# Patient Record
Sex: Female | Born: 1975 | Race: Black or African American | Hispanic: No | Marital: Single | Smoking: Never smoker
Health system: Southern US, Community
[De-identification: ages and names within clinical notes are randomized; demographics above are authoritative.]

## PROBLEM LIST (undated history)

## (undated) DIAGNOSIS — E7800 Pure hypercholesterolemia, unspecified: Secondary | ICD-10-CM

## (undated) DIAGNOSIS — I10 Essential (primary) hypertension: Secondary | ICD-10-CM

## (undated) DIAGNOSIS — E119 Type 2 diabetes mellitus without complications: Secondary | ICD-10-CM

## (undated) HISTORY — PX: CHOLECYSTECTOMY: SHX55

---

## 2018-04-28 ENCOUNTER — Emergency Department (HOSPITAL_BASED_OUTPATIENT_CLINIC_OR_DEPARTMENT_OTHER): Payer: BLUE CROSS/BLUE SHIELD

## 2018-04-28 ENCOUNTER — Other Ambulatory Visit: Payer: Self-pay

## 2018-04-28 ENCOUNTER — Inpatient Hospital Stay (HOSPITAL_BASED_OUTPATIENT_CLINIC_OR_DEPARTMENT_OTHER)
Admission: EM | Admit: 2018-04-28 | Discharge: 2018-04-30 | DRG: 872 | Disposition: A | Discharge status: Home / Self Care | Payer: BLUE CROSS/BLUE SHIELD | Attending: Family Medicine | Admitting: Family Medicine

## 2018-04-28 ENCOUNTER — Encounter (HOSPITAL_BASED_OUTPATIENT_CLINIC_OR_DEPARTMENT_OTHER): Payer: Self-pay

## 2018-04-28 DIAGNOSIS — N179 Acute kidney failure, unspecified: Secondary | ICD-10-CM | POA: Diagnosis present

## 2018-04-28 DIAGNOSIS — Z79899 Other long term (current) drug therapy: Secondary | ICD-10-CM

## 2018-04-28 DIAGNOSIS — IMO0002 Reserved for concepts with insufficient information to code with codable children: Secondary | ICD-10-CM | POA: Diagnosis present

## 2018-04-28 DIAGNOSIS — E872 Acidosis, unspecified: Secondary | ICD-10-CM

## 2018-04-28 DIAGNOSIS — Z888 Allergy status to other drugs, medicaments and biological substances status: Secondary | ICD-10-CM

## 2018-04-28 DIAGNOSIS — L309 Dermatitis, unspecified: Secondary | ICD-10-CM | POA: Diagnosis present

## 2018-04-28 DIAGNOSIS — R739 Hyperglycemia, unspecified: Secondary | ICD-10-CM | POA: Diagnosis not present

## 2018-04-28 DIAGNOSIS — D849 Immunodeficiency, unspecified: Secondary | ICD-10-CM

## 2018-04-28 DIAGNOSIS — A4151 Sepsis due to Escherichia coli [E. coli]: Principal | ICD-10-CM | POA: Diagnosis present

## 2018-04-28 DIAGNOSIS — N3000 Acute cystitis without hematuria: Secondary | ICD-10-CM | POA: Diagnosis present

## 2018-04-28 DIAGNOSIS — E1165 Type 2 diabetes mellitus with hyperglycemia: Secondary | ICD-10-CM | POA: Diagnosis present

## 2018-04-28 DIAGNOSIS — E871 Hypo-osmolality and hyponatremia: Secondary | ICD-10-CM | POA: Diagnosis present

## 2018-04-28 DIAGNOSIS — E860 Dehydration: Secondary | ICD-10-CM | POA: Diagnosis present

## 2018-04-28 DIAGNOSIS — B370 Candidal stomatitis: Secondary | ICD-10-CM | POA: Diagnosis not present

## 2018-04-28 DIAGNOSIS — A419 Sepsis, unspecified organism: Secondary | ICD-10-CM | POA: Diagnosis present

## 2018-04-28 DIAGNOSIS — E876 Hypokalemia: Secondary | ICD-10-CM | POA: Diagnosis present

## 2018-04-28 DIAGNOSIS — N390 Urinary tract infection, site not specified: Secondary | ICD-10-CM | POA: Diagnosis not present

## 2018-04-28 DIAGNOSIS — I10 Essential (primary) hypertension: Secondary | ICD-10-CM | POA: Diagnosis present

## 2018-04-28 DIAGNOSIS — R6510 Systemic inflammatory response syndrome (SIRS) of non-infectious origin without acute organ dysfunction: Secondary | ICD-10-CM | POA: Diagnosis not present

## 2018-04-28 DIAGNOSIS — E7800 Pure hypercholesterolemia, unspecified: Secondary | ICD-10-CM | POA: Diagnosis present

## 2018-04-28 DIAGNOSIS — E0865 Diabetes mellitus due to underlying condition with hyperglycemia: Secondary | ICD-10-CM | POA: Diagnosis not present

## 2018-04-28 DIAGNOSIS — Z9641 Presence of insulin pump (external) (internal): Secondary | ICD-10-CM | POA: Diagnosis present

## 2018-04-28 DIAGNOSIS — Z7982 Long term (current) use of aspirin: Secondary | ICD-10-CM

## 2018-04-28 DIAGNOSIS — D899 Disorder involving the immune mechanism, unspecified: Secondary | ICD-10-CM

## 2018-04-28 HISTORY — DX: Type 2 diabetes mellitus without complications: E11.9

## 2018-04-28 HISTORY — DX: Essential (primary) hypertension: I10

## 2018-04-28 HISTORY — DX: Pure hypercholesterolemia, unspecified: E78.00

## 2018-04-28 LAB — CBC WITH DIFFERENTIAL/PLATELET
BASOS ABS: 0 10*3/uL (ref 0.0–0.1)
BASOS PCT: 0 %
EOS PCT: 1 %
Eosinophils Absolute: 0.1 10*3/uL (ref 0.0–0.7)
HCT: 42.8 % (ref 36.0–46.0)
Hemoglobin: 14.8 g/dL (ref 12.0–15.0)
LYMPHS PCT: 19 %
Lymphs Abs: 1.8 10*3/uL (ref 0.7–4.0)
MCH: 28.6 pg (ref 26.0–34.0)
MCHC: 34.6 g/dL (ref 30.0–36.0)
MCV: 82.8 fL (ref 78.0–100.0)
Monocytes Absolute: 0.9 10*3/uL (ref 0.1–1.0)
Monocytes Relative: 10 %
Neutro Abs: 6.7 10*3/uL (ref 1.7–7.7)
Neutrophils Relative %: 70 %
PLATELETS: 654 10*3/uL — AB (ref 150–400)
RBC: 5.17 MIL/uL — AB (ref 3.87–5.11)
RDW: 14.6 % (ref 11.5–15.5)
WBC: 9.5 10*3/uL (ref 4.0–10.5)

## 2018-04-28 LAB — URINALYSIS, ROUTINE W REFLEX MICROSCOPIC
KETONES UR: 15 mg/dL — AB
Nitrite: POSITIVE — AB
PH: 5.5 (ref 5.0–8.0)
Protein, ur: >300 mg/dL — AB
Specific Gravity, Urine: 1.02 (ref 1.005–1.030)

## 2018-04-28 LAB — I-STAT VENOUS BLOOD GAS, ED
Acid-Base Excess: 10 mmol/L — ABNORMAL HIGH (ref 0.0–2.0)
BICARBONATE: 34.5 mmol/L — AB (ref 20.0–28.0)
O2 Saturation: 35 %
PCO2 VEN: 44.8 mmHg (ref 44.0–60.0)
PO2 VEN: 20 mmHg — AB (ref 32.0–45.0)
Patient temperature: 100
TCO2: 36 mmol/L — ABNORMAL HIGH (ref 22–32)
pH, Ven: 7.497 — ABNORMAL HIGH (ref 7.250–7.430)

## 2018-04-28 LAB — COMPREHENSIVE METABOLIC PANEL
ALT: 11 U/L (ref 0–44)
AST: 18 U/L (ref 15–41)
Albumin: 4.4 g/dL (ref 3.5–5.0)
Alkaline Phosphatase: 142 U/L — ABNORMAL HIGH (ref 38–126)
Anion gap: 15 (ref 5–15)
BILIRUBIN TOTAL: 0.5 mg/dL (ref 0.3–1.2)
BUN: 16 mg/dL (ref 6–20)
CO2: 36 mmol/L — ABNORMAL HIGH (ref 22–32)
CREATININE: 1.37 mg/dL — AB (ref 0.44–1.00)
Calcium: 9.9 mg/dL (ref 8.9–10.3)
Chloride: 77 mmol/L — ABNORMAL LOW (ref 98–111)
GFR calc Af Amer: 54 mL/min — ABNORMAL LOW (ref >60)
GFR, EST NON AFRICAN AMERICAN: 47 mL/min — AB (ref >60)
Glucose, Bld: 452 mg/dL — ABNORMAL HIGH (ref 70–99)
POTASSIUM: 3.3 mmol/L — AB (ref 3.5–5.1)
Sodium: 128 mmol/L — ABNORMAL LOW (ref 135–145)
TOTAL PROTEIN: 9.8 g/dL — AB (ref 6.5–8.1)

## 2018-04-28 LAB — I-STAT CG4 LACTIC ACID, ED
LACTIC ACID, VENOUS: 3.13 mmol/L — AB (ref 0.5–1.9)
Lactic Acid, Venous: 2.31 mmol/L (ref 0.5–1.9)

## 2018-04-28 LAB — GLUCOSE, CAPILLARY
Glucose-Capillary: 244 mg/dL — ABNORMAL HIGH (ref 70–99)
Glucose-Capillary: 131 mg/dL — ABNORMAL HIGH (ref 70–99)

## 2018-04-28 LAB — CBG MONITORING, ED
Glucose-Capillary: 466 mg/dL — ABNORMAL HIGH (ref 70–99)
Glucose-Capillary: 350 mg/dL — ABNORMAL HIGH (ref 70–99)

## 2018-04-28 LAB — RAPID URINE DRUG SCREEN, HOSP PERFORMED
Amphetamines: NOT DETECTED
Benzodiazepines: POSITIVE — AB
Cocaine: NOT DETECTED
OPIATES: NOT DETECTED
Tetrahydrocannabinol: NOT DETECTED

## 2018-04-28 LAB — URINALYSIS, MICROSCOPIC (REFLEX)

## 2018-04-28 LAB — PREGNANCY, URINE: PREG TEST UR: NEGATIVE

## 2018-04-28 MED ORDER — ROSUVASTATIN CALCIUM 10 MG PO TABS
20.0000 mg | ORAL_TABLET | Freq: Every day | ORAL | Status: DC
  Administered 2018-04-28 – 2018-04-29 (×2): 20 mg via ORAL
  Filled 2018-04-28 (×2): qty 2

## 2018-04-28 MED ORDER — ONDANSETRON HCL 4 MG/2ML IJ SOLN: 4.0000 mg | Freq: Four times a day (QID) | INTRAMUSCULAR | Status: DC | PRN

## 2018-04-28 MED ORDER — ACETAMINOPHEN 325 MG PO TABS
650.0000 mg | ORAL_TABLET | Freq: Four times a day (QID) | ORAL | Status: DC | PRN
  Administered 2018-04-28 – 2018-04-29 (×2): 650 mg via ORAL
  Filled 2018-04-28 (×2): qty 2

## 2018-04-28 MED ORDER — ONDANSETRON HCL 4 MG PO TABS: 4.0000 mg | ORAL_TABLET | Freq: Four times a day (QID) | ORAL | Status: DC | PRN

## 2018-04-28 MED ORDER — IPRATROPIUM-ALBUTEROL 20-100 MCG/ACT IN AERS: 1.0000 | INHALATION_SPRAY | Freq: Four times a day (QID) | RESPIRATORY_TRACT | Status: DC | PRN

## 2018-04-28 MED ORDER — INSULIN DETEMIR 100 UNIT/ML ~~LOC~~ SOLN
10.0000 [IU] | Freq: Every day | SUBCUTANEOUS | Status: DC
  Administered 2018-04-28: 10 [IU] via SUBCUTANEOUS
  Filled 2018-04-28 (×2): qty 0.1

## 2018-04-28 MED ORDER — FOLIC ACID 1 MG PO TABS
1.0000 mg | ORAL_TABLET | Freq: Every day | ORAL | Status: DC
  Administered 2018-04-29 – 2018-04-30 (×2): 1 mg via ORAL
  Filled 2018-04-28 (×2): qty 1

## 2018-04-28 MED ORDER — INSULIN REGULAR HUMAN 100 UNIT/ML IJ SOLN
10.0000 [IU] | Freq: Once | INTRAMUSCULAR | Status: AC
  Administered 2018-04-28: 10 [IU] via SUBCUTANEOUS
  Filled 2018-04-28: qty 1

## 2018-04-28 MED ORDER — ENOXAPARIN SODIUM 40 MG/0.4ML ~~LOC~~ SOLN
40.0000 mg | SUBCUTANEOUS | Status: DC
  Administered 2018-04-28 – 2018-04-29 (×2): 40 mg via SUBCUTANEOUS
  Filled 2018-04-28 (×2): qty 0.4

## 2018-04-28 MED ORDER — SODIUM CHLORIDE 0.9 % IV SOLN
1.0000 g | Freq: Once | INTRAVENOUS | Status: AC
  Administered 2018-04-28: 1 g via INTRAVENOUS
  Filled 2018-04-28: qty 10

## 2018-04-28 MED ORDER — MONTELUKAST SODIUM 10 MG PO TABS: 10.0000 mg | ORAL_TABLET | Freq: Every evening | ORAL | Status: DC | PRN

## 2018-04-28 MED ORDER — SODIUM CHLORIDE 0.9 % IV BOLUS
2000.0000 mL | Freq: Once | INTRAVENOUS | Status: AC
  Administered 2018-04-28: 2000 mL via INTRAVENOUS

## 2018-04-28 MED ORDER — SODIUM CHLORIDE 0.9 % IV BOLUS (SEPSIS)
1000.0000 mL | Freq: Once | INTRAVENOUS | Status: AC
  Administered 2018-04-28: 1000 mL via INTRAVENOUS

## 2018-04-28 MED ORDER — INSULIN ASPART 100 UNIT/ML ~~LOC~~ SOLN: 0.0000 [IU] | Freq: Every day | SUBCUTANEOUS | Status: DC

## 2018-04-28 MED ORDER — SODIUM CHLORIDE 0.9 % IV BOLUS: 1000.0000 mL | Freq: Once | INTRAVENOUS | Status: DC

## 2018-04-28 MED ORDER — INSULIN ASPART 100 UNIT/ML ~~LOC~~ SOLN
0.0000 [IU] | Freq: Three times a day (TID) | SUBCUTANEOUS | Status: DC
  Administered 2018-04-28: 7 [IU] via SUBCUTANEOUS
  Administered 2018-04-29: 15 [IU] via SUBCUTANEOUS
  Administered 2018-04-29: 4 [IU] via SUBCUTANEOUS

## 2018-04-28 MED ORDER — ACETAMINOPHEN 650 MG RE SUPP: 650.0000 mg | Freq: Four times a day (QID) | RECTAL | Status: DC | PRN

## 2018-04-28 MED ORDER — ALPRAZOLAM 1 MG PO TABS: 1.0000 mg | ORAL_TABLET | Freq: Every day | ORAL | Status: DC | PRN

## 2018-04-28 MED ORDER — ASPIRIN 81 MG PO CHEW
81.0000 mg | CHEWABLE_TABLET | Freq: Every day | ORAL | Status: DC
  Administered 2018-04-29 – 2018-04-30 (×2): 81 mg via ORAL
  Filled 2018-04-28 (×2): qty 1

## 2018-04-28 MED ORDER — SODIUM CHLORIDE 0.9 % IV SOLN
INTRAVENOUS | Status: DC
  Administered 2018-04-28: 18:00:00 via INTRAVENOUS

## 2018-04-28 MED ORDER — AMITRIPTYLINE HCL 25 MG PO TABS
50.0000 mg | ORAL_TABLET | Freq: Every day | ORAL | Status: DC
  Administered 2018-04-28 – 2018-04-29 (×2): 50 mg via ORAL
  Filled 2018-04-28 (×2): qty 2

## 2018-04-28 MED ORDER — INSULIN ASPART 100 UNIT/ML ~~LOC~~ SOLN
5.0000 [IU] | Freq: Three times a day (TID) | SUBCUTANEOUS | Status: DC
  Administered 2018-04-28 – 2018-04-29 (×3): 5 [IU] via SUBCUTANEOUS

## 2018-04-28 MED ORDER — CARVEDILOL 6.25 MG PO TABS
6.2500 mg | ORAL_TABLET | Freq: Two times a day (BID) | ORAL | Status: DC
  Administered 2018-04-28 – 2018-04-30 (×4): 6.25 mg via ORAL
  Filled 2018-04-28 (×4): qty 1

## 2018-04-28 MED ORDER — FLUCONAZOLE 150 MG PO TABS
150.0000 mg | ORAL_TABLET | Freq: Once | ORAL | Status: AC
  Administered 2018-04-28: 150 mg via ORAL
  Filled 2018-04-28: qty 1

## 2018-04-28 MED ORDER — IPRATROPIUM-ALBUTEROL 0.5-2.5 (3) MG/3ML IN SOLN: 3.0000 mL | Freq: Four times a day (QID) | RESPIRATORY_TRACT | Status: DC | PRN

## 2018-04-28 MED ORDER — ALBUTEROL SULFATE (2.5 MG/3ML) 0.083% IN NEBU: 2.5000 mg | INHALATION_SOLUTION | Freq: Four times a day (QID) | RESPIRATORY_TRACT | Status: DC | PRN

## 2018-04-28 MED ORDER — PANTOPRAZOLE SODIUM 40 MG PO TBEC
40.0000 mg | DELAYED_RELEASE_TABLET | Freq: Every day | ORAL | Status: DC
  Administered 2018-04-29 – 2018-04-30 (×2): 40 mg via ORAL
  Filled 2018-04-28 (×2): qty 1

## 2018-04-28 MED ORDER — SODIUM CHLORIDE 0.9 % IV SOLN
1.0000 g | INTRAVENOUS | Status: DC
  Administered 2018-04-29 – 2018-04-30 (×2): 1 g via INTRAVENOUS
  Filled 2018-04-28 (×2): qty 1

## 2018-04-28 NOTE — ED Provider Notes (Signed)
[facility] EMERGENCY DEPARTMENT Provider Note   CSN: [id number] Arrival date & time: 04/28/18  1230     History   Chief Complaint Chief Complaint  Patient presents with  . Urinary Frequency    HPI Carol Guerra is a 42 y.o. female with a past medical history of DM 2, insulin-dependent, hypertension, who presents today for evaluation of multiple complaints.  She reports that for the past 2 days she has been not feeling well, to the point that she has been not checking her sugars or giving herself her insulin.  She reports that this started last Wednesday when they went to [place] when she was having urinary frequency.  On the plane she noted that she had to pee about every 10 minutes however it was a small amount.  She had pain with urination also.  She reports that it has been gradually getting worse, however today she has been unable to urinate.  She reports like she feels like she is having an out of body experience, is having a hard time finding her words and is occasionally tripping over her own feet.  This is been going on for multiple days.  She reports irregular menstrual cycles, does not believe she is pregnant.  She is sexually active with a partner who she is in a committed relationship with.    She recently started on CellCept for eczema and has been taking this regularly since 7/1.  She previously was on methotrexate.    She denies any headache or visual changes.  She reports pain in the middle of her abdomen, and says it radiates to the flanks.  HPI  Past Medical History:  Diagnosis Date  . Diabetes mellitus without complication (HCC)   . High cholesterol   . Hypertension     Patient Active Problem List   Diagnosis Date Noted  . SIRS (systemic inflammatory response syndrome) (HCC) 04/28/2018    Past Surgical History:  Procedure Laterality Date  . CHOLECYSTECTOMY       OB History   None      Home Medications    Prior to Admission medications    Not on File    Family History No family history on file.  Social History Social History   Tobacco Use  . Smoking status: Never Smoker  . Smokeless tobacco: Never Used  Substance Use Topics  . Alcohol use: Never    Frequency: Never  . Drug use: Never     Allergies   Amlodipine   Review of Systems Review of Systems  Constitutional: Positive for activity change, chills, fatigue and fever.  HENT: Positive for mouth sores. Negative for ear pain and sore throat.   Eyes: Negative for pain and visual disturbance.  Respiratory: Negative for cough, chest tightness and shortness of breath.   Cardiovascular: Negative for chest pain and palpitations.  Gastrointestinal: Positive for abdominal pain, nausea and vomiting. Negative for diarrhea.  Genitourinary: Positive for decreased urine volume, difficulty urinating, dysuria, flank pain, frequency, pelvic pain and urgency. Negative for hematuria.  Musculoskeletal: Negative for arthralgias and back pain.  Skin: Negative for color change and rash.  Neurological: Negative for seizures, syncope and headaches.       Reported word finding difficulty, "tripping over own feet."  All other systems reviewed and are negative.    Physical Exam Updated Vital Signs BP (!) 149/94 (BP Location: Right Wrist)   Pulse (!) 103   Temp 100 F (37.8 C) (Rectal)   Resp  20   Ht 5\' 6"  (1.676 m)   Wt 88.5 kg (195 lb)   SpO2 99%   BMI 31.47 kg/m   Physical Exam  Constitutional: She is oriented to person, place, and time. She appears well-developed and well-nourished. No distress.  HENT:  Head: Normocephalic and atraumatic.  Multiple areas of small white plaques on the buccal mucosa/ulcers.   Eyes: Pupils are equal, round, and reactive to light. Conjunctivae and EOM are normal.  Neck: Normal range of motion. Neck supple.  Cardiovascular: Regular rhythm and intact distal pulses.  No murmur heard. Pulmonary/Chest: Effort normal and breath sounds  normal. No stridor. No respiratory distress. She has no wheezes.  Abdominal: Soft. Bowel sounds are normal. She exhibits no distension. There is tenderness in the suprapubic area.  Musculoskeletal: Normal range of motion. She exhibits deformity. She exhibits no edema.  Lymphadenopathy:    She has no cervical adenopathy.  Neurological: She is alert and oriented to person, place, and time. No sensory deficit.  Patient speech is fluent without evidence of dysarthria or aphasia.  She moves all 4 extremities spontaneously, follows complex commands.   Skin: Skin is warm and dry. She is not diaphoretic.  Psychiatric: She has a normal mood and affect.  Nursing note and vitals reviewed.    ED Treatments / Results  Labs (all labs ordered are listed, but only abnormal results are displayed) Labs Reviewed  URINALYSIS, ROUTINE W REFLEX MICROSCOPIC - Abnormal; Notable for the following components:      Result Value   APPearance CLOUDY (*)    Glucose, UA >=500 (*)    Hgb urine dipstick LARGE (*)    Bilirubin Urine SMALL (*)    Ketones, ur 15 (*)    Protein, ur >300 (*)    Nitrite POSITIVE (*)    Leukocytes, UA SMALL (*)    All other components within normal limits  COMPREHENSIVE METABOLIC PANEL - Abnormal; Notable for the following components:   Sodium 128 (*)    Potassium 3.3 (*)    Chloride 77 (*)    CO2 36 (*)    Glucose, Bld 452 (*)    Creatinine, Ser 1.37 (*)    Total Protein 9.8 (*)    Alkaline Phosphatase 142 (*)    GFR calc non Af Amer 47 (*)    GFR calc Af Amer 54 (*)    All other components within normal limits  CBC WITH DIFFERENTIAL/PLATELET - Abnormal; Notable for the following components:   RBC 5.17 (*)    Platelets 654 (*)    All other components within normal limits  RAPID URINE DRUG SCREEN, HOSP PERFORMED - Abnormal; Notable for the following components:   Benzodiazepines POSITIVE (*)    Barbiturates   (*)    Value: Result not available. Reagent lot number recalled by  manufacturer.   All other components within normal limits  URINALYSIS, MICROSCOPIC (REFLEX) - Abnormal; Notable for the following components:   Bacteria, UA MANY (*)    All other components within normal limits  CBG MONITORING, ED - Abnormal; Notable for the following components:   Glucose-Capillary 466 (*)    All other components within normal limits  I-STAT CG4 LACTIC ACID, ED - Abnormal; Notable for the following components:   Lactic Acid, Venous 3.13 (*)    All other components within normal limits  I-STAT VENOUS BLOOD GAS, ED - Abnormal; Notable for the following components:   pH, Ven 7.497 (*)    pO2, Ven 20.0 (*)  Bicarbonate 34.5 (*)    TCO2 36 (*)    Acid-Base Excess 10.0 (*)    All other components within normal limits  CBG MONITORING, ED - Abnormal; Notable for the following components:   Glucose-Capillary 350 (*)    All other components within normal limits  CULTURE, BLOOD (ROUTINE X 2)  CULTURE, BLOOD (ROUTINE X 2)  URINE CULTURE  PREGNANCY, URINE  BLOOD GAS, VENOUS  I-STAT CG4 LACTIC ACID, ED    EKG EKG Interpretation  Date/Time:  Tuesday April 28 2018 13:16:38 EDT Ventricular Rate:  124 PR Interval:    QRS Duration: 88 QT Interval:  336 QTC Calculation: 483 R Axis:   19 Text Interpretation:  Sinus tachycardia Probable left atrial enlargement Left ventricular hypertrophy Baseline wander in lead(s) V2 No old tracing to compare Confirmed by Azalia Bilis ([id number]) on 04/28/2018 1:21:22 PM   Radiology Dg Chest 2 View  Result Date: 04/28/2018 CLINICAL DATA:  Mental status change, instability, oral ulcers, difficulty urinating. History of diabetes and hypertension. EXAM: CHEST - 2 VIEW COMPARISON:  None in PACs FINDINGS: The lungs are mildly hypoinflated but clear. The heart and pulmonary vascularity are normal. The mediastinum is normal in width. The trachea is midline. The bony thorax is unremarkable. IMPRESSION: There is no pneumonia, CHF, nor other acute  cardiopulmonary abnormality. Electronically Signed   By: David  [place]az[place]and M.D.   On: 04/28/2018 14:11    Procedures Procedures (including critical care time)  CRITICAL CARE Performed by: Lyndel Safe Total critical care time: 40 minutes Critical care time was exclusive of separately billable procedures and treating other patients. Critical care was necessary to treat or prevent imminent or life-threatening deterioration. Critical care was time spent personally by me on the following activities: development of treatment plan with patient and/or surrogate as well as nursing, discussions with consultants, evaluation of patient's response to treatment, examination of patient, obtaining history from patient or surrogate, ordering and performing treatments and interventions, ordering and review of laboratory studies, ordering and review of radiographic studies, pulse oximetry and re-evaluation of patient's condition. Urosepsis, hyperglycemia,   Medications Ordered in ED Medications  cefTRIAXone (ROCEPHIN) 1 g in sodium chloride 0.9 % 100 mL IVPB (0 g Intravenous Stopped 04/28/18 1416)  sodium chloride 0.9 % bolus 1,000 mL (0 mLs Intravenous Stopped 04/28/18 1502)  sodium chloride 0.9 % bolus 2,000 mL (0 mLs Intravenous Stopped 04/28/18 1450)  insulin regular (NOVOLIN R,HUMULIN R) 100 units/mL injection 10 Units (10 Units Subcutaneous Given 04/28/18 1501)     Initial Impression / Assessment and Plan / ED Course  I have reviewed the triage vital signs and the nursing notes.  Pertinent labs & imaging results that were available during my care of the patient were reviewed by me and considered in my medical decision making (see chart for details).  Clinical Course as of Apr 29 1531  Tue Apr 28, 2018  1326 Based on history consistent with what sounds like a UTI vs pyelo and tachycardia suspected sepsis, code sepsis called.    [EH]  1420 Sepsis re-evaluation complete.    [EH]  1517 Spoke with  hospitalist at [facility] who will admit patient.    [EH]    Clinical Course User Index [EH] Cristina Gong, PA-C   Marta Antu presents today for evaluation of urinary frequency and generally not feeling well.  She is immunosuppressed with CellCept for eczema which she has been taking since 7/1.  Her symptoms began around 7/3.  She had fevers at  home prior to arrival.  Upon arrival here she was tachycardic with a heart rate of 123.  While in the department she became tachypneic also.  Based on her history of dysuria, urgency, and frequency along with tachycardia code sepsis was called.  Lactic acid came back elevated above 3.  She was normotensive, however given 3 L/kg fluid bolus, as she is also hyperglycemic with an initial sugar of 466.  This is consistent with patient stating she has not been taking her insulin for the past 2 days.  Urine consistent with UTI, cloudy, over 500 glucose with large blood, 15 ketones, over 300 protein, small leukocytes, and nitrite positive.  Patient's sodium was low at 128, potassium 3.3, chloride 77, CO2 36, creatinine 1.37.  White count is not elevated, however patient is on CellCept.  Rectal temp 100.0.  Urine and blood cultures were obtained.  Patient was started on Rocephin for urosepsis.  She was given 10 units of insulin.  Labs are not consistent with DKA.  Hospitalist at [facility] was consulted, I spoke with Dr. Allena Katz who agreed to admit.  Patient will be transferred to [facility] long for admission.   Final Clinical Impressions(s) / ED Diagnoses   Final diagnoses:  Sepsis, due to unspecified organism [facility])  Acute cystitis without hematuria  Immunosuppressed status (HCC)  Hyponatremia  Lactic acidosis  Hyperglycemia    ED Discharge Orders    None       Norman Clay 04/28/18 1537    Azalia Bilis, MD 04/28/18 [phone]

## 2018-04-28 NOTE — ED Triage Notes (Addendum)
Pt and mother state pt was on vacation last week when she started having urinary freq-pt states she has been "tripping over my feet" "having out of body experience" x 4 days-pt A/O-answering ?s appropriately-NAD-also c/o blisters in mouth x 2 days-states she was sent from PCP due to unable to urinate for UA-pt ambulated into ED WR-requested/received w/c into triage

## 2018-04-28 NOTE — ED Notes (Signed)
Critical Lactic Acid 2.31 reported to [place], R[place]ity fundraiser

## 2018-04-28 NOTE — H&P (Signed)
History and Physical    Carol Guerra NWG:[id number] DOB: 1975-12-15 DOA: 04/28/2018  PCP: Abelardo [facility]elPremier, [facility]   Patient coming from: Home  I have personally briefly reviewed [facility]  Chief Complaint: Urinary frequency  HPI: Carol Guerra is a [age] y.o. female with medical history significant of diabetes mellitus type 2 insulin-dependent with insulin pump, hypertension, eczema for which patient used to be previously on methotrexate but was switched to CellCept a week ago presents with dysuria, increased urinary frequency, abdominal pain, nausea, very poor appetite for the last 5 to 6 days.  She started having urinary symptoms last Wednesday when [place] went to [place] a vacation.  Her symptoms gradually got worse and by the time she came back from her vacation, her appetite had become very poor, she is feeling very weak with worsening abdominal pain.  Patient denies any hematuria, diarrhea, chest pain, shortness of breath, loss of consciousness, seizures.  She complain of nausea and chills without documented fevers.  She also states that she has not been acting herself lately.  ED Course: She was found to have UTI along with hyperglycemia and hyponatremia.  She was given IV fluids and antibiotics.  Hospitalist service was called to evaluate the patient. [facility].  Review of Systems: As per HPI otherwise 10 point review of systems negative.    Past Medical History:  Diagnosis Date  . Diabetes mellitus without complication (HCC)   . High cholesterol   . Hypertension     Past Surgical History:  Procedure Laterality Date  . CHOLECYSTECTOMY     Social history  reports that she has never smoked. She has never used smokeless tobacco. She reports that she does not drink alcohol or use drugs.  Allergies  Allergen Reactions  . Amlodipine Anaphylaxis  . Canagliflozin Anaphylaxis   . Clonidine Derivatives Anaphylaxis    Family history: Negative for cancer or TB  Prior to Admission medications   Medication Sig Start Date End Date Taking? Authorizing Provider  albuterol (PROVENTIL) (2.5 MG/3ML) 0.083% nebulizer solution VVN TID 04/07/18   [provider]  ALPRAZolam Prudy Feeler(XANAX) 1 MG tablet TK 1 T PO D UTD 04/15/18   [provider]  Erenest RasherALTAVERA 0.15-30 MG-MCG tablet  04/27/18   [provider]  amitriptyline (ELAVIL) 25 MG tablet TK 2 TS PO NIGHTLY 03/14/18   [provider]  ASPIRIN LOW DOSE 81 MG chewable tablet  03/11/18   [provider]  carvedilol (COREG) 6.25 MG tablet  04/07/18   [provider]  chlorthalidone (HYGROTON) 25 MG tablet  04/17/18   [provider]  COMBIVENT RESPIMAT 20-100 MCG/ACT AERS respimat INL 1 PUFF ITL QID 04/07/18   [provider]  FLOVENT HFA 110 MCG/ACT inhaler INL 1 PUFF ITL BID 03/03/18   [provider]  folic acid (FOLVITE) 1 MG tablet  02/18/18   [provider]  hydrOXYzine (ATARAX/VISTARIL) 25 MG tablet  02/02/18   [provider]  losartan (COZAAR) 100 MG tablet TK 1 T PO D 03/15/18   [provider]  metFORMIN (GLUCOPHAGE-XR) 500 MG 24 hr tablet TK 2 TS PO D 03/20/18   [provider]  methotrexate (RHEUMATREX) 2.5 MG tablet TK 10 TS PO ONCE WEEKLY 02/18/18   [provider]  montelukast (SINGULAIR) 10 MG tablet  03/03/18   [provider]  NOVOLOG 100 UNIT/ML injection INJECT UP TO  100 UNITS UNDER THE SKIN QD UTD IN INSULIN DELIVERY DEVICE 04/10/18   [provider]  omeprazole (PRILOSEC) 40 MG capsule  04/05/18   [provider]  rosuvastatin (CRESTOR) 20 MG tablet TK 1 T PO  D 02/03/18   [provider]  telmisartan (MICARDIS) 40 MG tablet  04/07/18   [provider]  triamcinolone cream (KENALOG) 0.1 % APP TOPICALLY AA BID FOR 14 DAYS 04/20/18   [provider]  VICTOZA 18  MG/3ML SOPN  04/22/18   [provider]    Physical Exam: Vitals:   04/28/18 1414 04/28/18 1459 04/28/18 1541 04/28/18 1656  BP: 138/85 (!) 149/94 (!) 154/99 132/75  Pulse: (!) 108 (!) 103 (!) 108 (!) 106  Resp: (!) 25 20 14 18   Temp:   97.6 F (36.4 C) 97.8 F (36.6 C)  TempSrc:   Oral Oral  SpO2: 100% 99% 98% 99%  Weight:      Height:        Constitutional: NAD, calm, comfortable Vitals:   04/28/18 1414 04/28/18 1459 04/28/18 1541 04/28/18 1656  BP: 138/85 (!) 149/94 (!) 154/99 132/75  Pulse: (!) 108 (!) 103 (!) 108 (!) 106  Resp: (!) 25 20 14 18   Temp:   97.6 F (36.4 C) 97.8 F (36.6 C)  TempSrc:   Oral Oral  SpO2: 100% 99% 98% 99%  Weight:      Height:       Eyes: PERRL, lids and conjunctivae normal ENMT: Mucous membranes are dry.  Posterior pharynx clear of any exudate or lesions. Neck: normal, supple, no masses, no thyromegaly Respiratory: bilateral decreased breath sounds at bases, no wheezing, no crackles. Normal respiratory effort. No accessory muscle use.  Cardiovascular: S1 S2 positive, tachycardic. No extremity edema. 2+ pedal pulses.  Abdomen: Mild lower quadrant tenderness, no masses palpated. No hepatosplenomegaly. Bowel sounds positive.  No rebound tenderness Musculoskeletal: no clubbing / cyanosis. No joint deformity upper and lower extremities.  Skin: no rashes, lesions, ulcers. No induration Neurologic: CN 2-12 grossly intact. Moving extremities. No focal neurologic deficits.  Psychiatric: Normal judgment and insight. Alert and oriented x 3. Normal mood.    Labs on Admission: I have personally reviewed following labs and imaging studies  CBC: Recent Labs  Lab 04/28/18 1334  WBC 9.5  NEUTROABS 6.7  HGB 14.8  HCT 42.8  MCV 82.8  PLT 654*   Basic Metabolic Panel: Recent Labs  Lab 04/28/18 1334  NA 128*  K 3.3*  CL 77*  CO2 36*  GLUCOSE 452*  BUN 16  CREATININE 1.37*  CALCIUM 9.9   GFR: Estimated Creatinine Clearance: 60  mL/min (A) (by C-G formula based on SCr of 1.37 mg/dL (H)). Liver Function Tests: Recent Labs  Lab 04/28/18 1334  AST 18  ALT 11  ALKPHOS 142*  BILITOT 0.5  PROT 9.8*  ALBUMIN 4.4   No results for input(s): LIPASE, AMYLASE in the last 168 hours. No results for input(s): AMMONIA in the last 168 hours. Coagulation Profile: No results for input(s): INR, PROTIME in the last 168 hours. Cardiac Enzymes: No results for input(s): CKTOTAL, CKMB, CKMBINDEX, TROPONINI in the last 168 hours. BNP (last 3 results) No results for input(s): PROBNP in the last 8760 hours. HbA1C: No results for input(s): HGBA1C in the last 72 hours. CBG: Recent Labs  Lab 04/28/18 1248 04/28/18 1454  GLUCAP 466* 350*   Lipid Profile: No results for input(s): CHOL, HDL, LDLCALC, TRIG, CHOLHDL, LDLDIRECT in the last 72  hours. Thyroid Function Tests: No results for input(s): TSH, T4TOTAL, FREET4, T3FREE, THYROIDAB in the last 72 hours. Anemia Panel: No results for input(s): VITAMINB12, FOLATE, FERRITIN, TIBC, IRON, RETICCTPCT in the last 72 hours. Urine analysis:    Component Value Date/Time   COLORURINE YELLOW 04/28/2018 1336   APPEARANCEUR CLOUDY (A) 04/28/2018 1336   LABSPEC 1.020 04/28/2018 1336   PHURINE 5.5 04/28/2018 1336   GLUCOSEU >=500 (A) 04/28/2018 1336   HGBUR LARGE (A) 04/28/2018 1336   BILIRUBINUR SMALL (A) 04/28/2018 1336   KETONESUR 15 (A) 04/28/2018 1336   PROTEINUR >300 (A) 04/28/2018 1336   NITRITE POSITIVE (A) 04/28/2018 1336   LEUKOCYTESUR SMALL (A) 04/28/2018 1336    Radiological Exams on Admission: Dg Chest 2 View  Result Date: 04/28/2018 CLINICAL DATA:  Mental status change, instability, oral ulcers, difficulty urinating. History of diabetes and hypertension. EXAM: CHEST - 2 VIEW COMPARISON:  None in PACs FINDINGS: The lungs are mildly hypoinflated but clear. The heart and pulmonary vascularity are normal. The mediastinum is normal in width. The trachea is midline. The bony  thorax is unremarkable. IMPRESSION: There is no pneumonia, CHF, nor other acute cardiopulmonary abnormality. Electronically Signed   By: David  [place] M.D.   On: 04/28/2018 14:11    Assessment/Plan Active Problems:   Acute lower UTI   Sepsis (HCC)   Diabetes mellitus due to underlying condition, uncontrolled (HCC)   Hyperglycemia   Sepsis and lactic acidosis secondary to urinary tract infection -Patient was started on intravenous fluid bolus in the ED along with intravenous antibiotics.  Continue normal saline at 125 cc an hour.  Follow cultures.  Urinary tract infection -Continue Rocephin.  Follow cultures.  Diabetes mellitus type 2 uncontrolled with hyperglycemia -Patient uses insulin pump at home.  Will hold insulin pump while patient is in the hospital.   CBGs with insulin sliding scale coverage.  NovoLog with meals.  Levemir at bedtime.  Hemoglobin A1c in a.m.  Resume insulin pump upon discharge.  Hold metformin  Probable acute kidney injury -Probably from dehydration.  No prior creatinine available.  Continue IV fluids.  Repeat a.m. labs.  Hold chlorthalidone, telmisartan and metformin.  Hyponatremia -Probably from hyperglycemia and dehydration.  Continue IV fluids.  Repeat a.m. labs  Thrombocytosis -Probably reactive.  Repeat a.m. labs  Hypertension -Monitor.  Continue Coreg.  Hold telmisartan and metformin  History of eczema -Recently started on CellCept as an outpatient, switched from methotrexate.  Hold CellCept.  Outpatient follow-up with dermatology  DVT prophylaxis: Lovenox Code Status: Full Family Communication: None at bedside Disposition Plan: Home in 1 to 2 days once clinically improved Consults called: None Admission status: Inpatient in MedSurg  Severity of Illness: The appropriate patient status for this patient is INPATIENT. Inpatient status is judged to be reasonable and necessary in order to provide the required intensity of service to ensure the  patient's safety. The patient's presenting symptoms, physical exam findings, and initial radiographic and laboratory data in the context of their chronic comorbidities is felt to place them at high risk for further clinical deterioration. Furthermore, it is not anticipated that the patient will be medically stable for discharge from the hospital within 2 midnights of admission. The following factors support the patient status of inpatient.   " The patient's presenting symptoms include urinary frequency/poor appetite. " The worrisome physical exam findings include tachycardia/lower abdominal tenderness " The initial radiographic and laboratory data are worrisome because of hyperglycemia/elevated lactic acid. " The chronic co-morbidities include diabetes mellitus type  2/hypertension.   * I certify that at the point of admission it is my clinical judgment that the patient will require inpatient hospital care spanning beyond 2 midnights from the point of admission due to high intensity of service, high risk for further deterioration and high frequency of surveillance required.Glade Lloyd MD [facility] Pager [phone]  If 7PM-7AM, please contact night-coverage [email or web address] Password [facility]  04/28/2018, 5:32 PM

## 2018-04-28 NOTE — ED Notes (Signed)
ED Provider at bedside. 

## 2018-04-29 DIAGNOSIS — A419 Sepsis, unspecified organism: Secondary | ICD-10-CM

## 2018-04-29 DIAGNOSIS — E0865 Diabetes mellitus due to underlying condition with hyperglycemia: Secondary | ICD-10-CM

## 2018-04-29 DIAGNOSIS — N390 Urinary tract infection, site not specified: Secondary | ICD-10-CM

## 2018-04-29 LAB — COMPREHENSIVE METABOLIC PANEL
ALT: 10 U/L (ref 0–44)
ANION GAP: 12 (ref 5–15)
AST: 15 U/L (ref 15–41)
Albumin: 3.4 g/dL — ABNORMAL LOW (ref 3.5–5.0)
Alkaline Phosphatase: 102 U/L (ref 38–126)
BILIRUBIN TOTAL: 0.3 mg/dL (ref 0.3–1.2)
BUN: 17 mg/dL (ref 6–20)
CALCIUM: 8.5 mg/dL — AB (ref 8.9–10.3)
CO2: 29 mmol/L (ref 22–32)
Chloride: 94 mmol/L — ABNORMAL LOW (ref 98–111)
Creatinine, Ser: 0.89 mg/dL (ref 0.44–1.00)
Glucose, Bld: 341 mg/dL — ABNORMAL HIGH (ref 70–99)
Potassium: 2.6 mmol/L — CL (ref 3.5–5.1)
SODIUM: 135 mmol/L (ref 135–145)
TOTAL PROTEIN: 7.3 g/dL (ref 6.5–8.1)

## 2018-04-29 LAB — CBC
HCT: 37.5 % (ref 36.0–46.0)
HEMOGLOBIN: 12.2 g/dL (ref 12.0–15.0)
MCH: 28.2 pg (ref 26.0–34.0)
MCHC: 32.5 g/dL (ref 30.0–36.0)
MCV: 86.8 fL (ref 78.0–100.0)
Platelets: 602 10*3/uL — ABNORMAL HIGH (ref 150–400)
RBC: 4.32 MIL/uL (ref 3.87–5.11)
RDW: 15.1 % (ref 11.5–15.5)
WBC: 7.8 10*3/uL (ref 4.0–10.5)

## 2018-04-29 LAB — GLUCOSE, CAPILLARY
GLUCOSE-CAPILLARY: 197 mg/dL — AB (ref 70–99)
GLUCOSE-CAPILLARY: 181 mg/dL — AB (ref 70–99)
Glucose-Capillary: 308 mg/dL — ABNORMAL HIGH (ref 70–99)
Glucose-Capillary: 194 mg/dL — ABNORMAL HIGH (ref 70–99)

## 2018-04-29 LAB — MAGNESIUM: MAGNESIUM: 2.1 mg/dL (ref 1.7–2.4)

## 2018-04-29 LAB — HIV ANTIBODY (ROUTINE TESTING W REFLEX): HIV Screen 4th Generation wRfx: NONREACTIVE

## 2018-04-29 MED ORDER — POTASSIUM CHLORIDE 10 MEQ/100ML IV SOLN
10.0000 meq | INTRAVENOUS | Status: AC
  Administered 2018-04-29 (×3): 10 meq via INTRAVENOUS
  Filled 2018-04-29 (×3): qty 100

## 2018-04-29 MED ORDER — INSULIN PUMP
SUBCUTANEOUS | Status: DC
  Administered 2018-04-29: 14 via SUBCUTANEOUS
  Administered 2018-04-30: 19 via SUBCUTANEOUS
  Administered 2018-04-30: 12 via SUBCUTANEOUS
  Filled 2018-04-29: qty 1

## 2018-04-29 NOTE — Progress Notes (Addendum)
PROGRESS NOTE [facility]   Carol Guerra   HYQ:[id number] DOB: 02-07-1976  DOA: 04/28/2018 PCP: [facility]   Brief Narrative:  Carol Guerra is a 42 year old female with medical history significant for diabetes mellitus type 2 insulin-dependent on insulin pump, hypertensionand eczema who presented to the emergency department complaining of urinary frequency abdominal pain nausea vomiting. Upon ED evaluation patient was found to have grossly abnormal UA, hyperglycemia and hyponatremia. Patient was admitted with working diagnosis of sepsis with suspected UTI.   Subjective: Patient seen and examined, patient c/o mild confusion (hard to find words) and mild abdominal pain, otherwise no other concerns.  Patient remains afebrile, tolerating diet well.  Denies chest pain and shortness of breath.  Assessment & Plan: Sepsis secondary to UTI Sepsis physiology has resolved, treat underlying causes.  UTI  Grossly abnormal UA, urine culture growing gram negative rods. Patient been treated with Rocephin, will continue and follow sensitivities. Follow-up blood cultures.  Acute renal failure In setting of infectious process Creatinine improved with IV hydration.  Continue to monitor renal function.  Diabetes mellitus type 2 uncontrolled with hyperglycemia Patient is insulin pump at home, while inpatient will place on Levemir at bedtime, continue insulin sliding scale.  Hold metformin.  Obtain diabetes coordinator on consult.  Monitor CBG closely.  A1c pending.  Pseudohyponatremia From hyperglycemia, resolved   Hypertension BP stable, holding Micardis and chlorthalidone due to AKI, continue Coreg  Monitor BP closely   Hypokalemia  Replete with IV  Check Mag and labs in AM   DVT prophylaxis: Lovenox Code Status: Full code Family Communication: None at bedside Disposition Plan: Home in 1 to 2 days  when sensitivities are resulted  Consultants:    None  Procedures:   None  Antimicrobials: Anti-infectives (From admission, onward)   Start     Dose/Rate Route Frequency Ordered Stop   04/29/18 1400  cefTRIAXone (ROCEPHIN) 1 g in sodium chloride 0.9 % 100 mL IVPB     1 g 200 mL/hr over 30 Minutes Intravenous Every 24 hours 04/28/18 1732     04/28/18 1830  fluconazole (DIFLUCAN) tablet 150 mg     150 mg Oral  Once 04/28/18 1732 04/28/18 1837   04/28/18 1330  cefTRIAXone (ROCEPHIN) 1 g in sodium chloride 0.9 % 100 mL IVPB     1 g 200 mL/hr over 30 Minutes Intravenous  Once 04/28/18 1324 04/28/18 1416         Objective: Vitals:   04/28/18 1541 04/28/18 1656 04/28/18 1959 04/29/18 0538  BP: (!) 154/99 132/75 119/81 115/73  Pulse: (!) 108 (!) 106 (!) 113 (!) 101  Resp: 14 18 (!) 21 (!) 24  Temp: 97.6 F (36.4 C) 97.8 F (36.6 C) 98.9 F (37.2 C) 98.5 F (36.9 C)  TempSrc: Oral Oral Oral Oral  SpO2: 98% 99% 100% 97%  Weight:      Height:        Intake/Output Summary (Last 24 hours) at 04/29/2018 1225 Last data filed at 04/29/2018 1007 Gross per 24 hour  Intake 5079.78 ml  Output 600 ml  Net 4479.78 ml   Filed Weights   04/28/18 1241  Weight: 88.5 kg (195 lb)    Examination:  General exam: Appears calm and comfortable  HEENT: OP moist and clear Respiratory system: Clear to auscultation. No wheezes,crackle or rhonchi Cardiovascular system: S1 & S2 heard, RRR. No JVD, murmurs, rubs or gallops Gastrointestinal system: Abdomen is nondistended, soft and nontender. No organomegaly or masses.  Central nervous system: Alert and oriented. No focal neurological deficits. Extremities: No pedal edema.  Skin: No rashes, lesions or ulcers Psychiatry: Mood & affect appropriate.    Data Reviewed: I have personally reviewed following labs and imaging studies  CBC: Recent Labs  Lab 04/28/18 1334 04/29/18 0610  WBC 9.5 7.8  NEUTROABS 6.7  --   HGB 14.8 12.2  HCT 42.8 37.5  MCV 82.8 86.8  PLT 654* 602*   Basic  Metabolic Panel: Recent Labs  Lab 04/28/18 1334 04/29/18 0610  NA 128* 135  K 3.3* 2.6*  CL 77* 94*  CO2 36* 29  GLUCOSE 452* 341*  BUN 16 17  CREATININE 1.37* 0.89  CALCIUM 9.9 8.5*  MG  --  2.1   GFR: Estimated Creatinine Clearance: 92.3 mL/min (by C-G formula based on SCr of 0.89 mg/dL). Liver Function Tests: Recent Labs  Lab 04/28/18 1334 04/29/18 0610  AST 18 15  ALT 11 10  ALKPHOS 142* 102  BILITOT 0.5 0.3  PROT 9.8* 7.3  ALBUMIN 4.4 3.4*   No results for input(s): LIPASE, AMYLASE in the last 168 hours. No results for input(s): AMMONIA in the last 168 hours. Coagulation Profile: No results for input(s): INR, PROTIME in the last 168 hours. Cardiac Enzymes: No results for input(s): CKTOTAL, CKMB, CKMBINDEX, TROPONINI in the last 168 hours. BNP (last 3 results) No results for input(s): PROBNP in the last 8760 hours. HbA1C: No results for input(s): HGBA1C in the last 72 hours. CBG: Recent Labs  Lab 04/28/18 1454 04/28/18 1807 04/28/18 2137 04/29/18 0722 04/29/18 1159  GLUCAP 350* 244* 131* 308* 197*   Lipid Profile: No results for input(s): CHOL, HDL, LDLCALC, TRIG, CHOLHDL, LDLDIRECT in the last 72 hours. Thyroid Function Tests: No results for input(s): TSH, T4TOTAL, FREET4, T3FREE, THYROIDAB in the last 72 hours. Anemia Panel: No results for input(s): VITAMINB12, FOLATE, FERRITIN, TIBC, IRON, RETICCTPCT in the last 72 hours. Sepsis Labs: Recent Labs  Lab 04/28/18 1340 04/28/18 1533  LATICACIDVEN 3.13* 2.31*    Recent Results (from the past 240 hour(s))  Urine culture     Status: Abnormal (Preliminary result)   Collection Time: 04/28/18  1:36 PM  Result Value Ref Range Status   Specimen Description   Final    URINE, RANDOM Performed at [facility] [place], [address] [place], [place] [address]    Special Requests   Final    NONE Performed at [facility], [address] [place], [place] [address]    Culture >=100,000  COLONIES/mL GRAM NEGATIVE RODS (A)  Final   Report Status PENDING  Incomplete     Radiology Studies: Dg Chest 2 View  Result Date: 04/28/2018 CLINICAL DATA:  Mental status change, instability, oral ulcers, difficulty urinating. History of diabetes and hypertension. EXAM: CHEST - 2 VIEW COMPARISON:  None in PACs FINDINGS: The lungs are mildly hypoinflated but clear. The heart and pulmonary vascularity are normal. The mediastinum is normal in width. The trachea is midline. The bony thorax is unremarkable. IMPRESSION: There is no pneumonia, CHF, nor other acute cardiopulmonary abnormality. Electronically Signed   By: David  SwazilandJordan M.D.   On: 04/28/2018 14:11    Scheduled Meds: . amitriptyline  50 mg Oral QHS  . aspirin  81 mg Oral Daily  . carvedilol  6.25 mg Oral BID WC  . enoxaparin (LOVENOX) injection  40 mg Subcutaneous Q24H  . folic acid  1 mg Oral Daily  . insulin aspart  0-20 Units Subcutaneous TID WC  . insulin aspart  0-5 Units Subcutaneous QHS  . insulin aspart  5 Units Subcutaneous TID WC  . insulin detemir  10 Units Subcutaneous QHS  . pantoprazole  40 mg Oral Daily  . rosuvastatin  20 mg Oral q1800   Continuous Infusions: . sodium chloride 125 mL/hr at 04/28/18 1807  . cefTRIAXone (ROCEPHIN)  IV    . potassium chloride Stopped (04/29/18 1159)     LOS: 1 day    Time spent: Total of 25 minutes spent with pt, greater than 50% of which was spent in discussion of  treatment, counseling and coordination of care    Latrelle Dodrill, MD Pager: Text Page via [email or web address]   If 7PM-7AM, please contact night-coverage [email or web address] 04/29/2018, 12:25 PM   Note - This record has been created using [employer]. Chart creation errors have been sought, but may not always have been located. Such creation errors do not reflect on the standard of medical care.

## 2018-04-29 NOTE — Progress Notes (Signed)
Inpatient Diabetes Program Recommendations  AACE/ADA: New Consensus Statement on Inpatient Glycemic Control (2015)  Target Ranges:  Prepandial:   less than 140 mg/dL      Peak postprandial:   less than 180 mg/dL (1-2 hours)      Critically ill patients:  140 - 180 mg/dL   Lab Results  Component Value Date   GLUCAP 197 (H) 04/29/2018    Review of Glycemic Control  Diabetes history: DM2 Outpatient Diabetes medications: Insulin pump, Ozempic  Current orders for Inpatient glycemic control: Levemir 10 units QHS, Novolog 0-20 units tidwc and hs + 5 units tidwc  HgbA1C pending. Pt has ordered a [employer] delivery for lunch/dinner.  Her pump settings are as follows: 12 am-3.4, 6 am-2.4, 4 pm - 3.4units/hr. Her target sugar is 120. Her insulin to carb ratio is 1:2 at breakfast and 1:2 at supper. Her sensitivity is 1:8.  Basal rate is 71 units/24H. Pt states she would prefer to put pump back on. Has supplies. Will speak with RN regarding insulin pump contract, etc.  Inpatient Diabetes Program Recommendations:   (if NOT going back on pump):  Increase Levemir to 40 units Q24H  Will continue to follow.  Thank you. Ailene Ardshonda Romaldo Saville, RD, LDN, CDE Inpatient Diabetes Coordinator [phone]

## 2018-04-30 DIAGNOSIS — A4151 Sepsis due to Escherichia coli [E. coli]: Principal | ICD-10-CM

## 2018-04-30 DIAGNOSIS — N179 Acute kidney failure, unspecified: Secondary | ICD-10-CM

## 2018-04-30 LAB — CBC WITH DIFFERENTIAL/PLATELET
BASOS PCT: 1 %
Basophils Absolute: 0 10*3/uL (ref 0.0–0.1)
EOS ABS: 0.2 10*3/uL (ref 0.0–0.7)
Eosinophils Relative: 3 %
HCT: 33.3 % — ABNORMAL LOW (ref 36.0–46.0)
Hemoglobin: 10.9 g/dL — ABNORMAL LOW (ref 12.0–15.0)
Lymphocytes Relative: 38 %
Lymphs Abs: 2.2 10*3/uL (ref 0.7–4.0)
MCH: 28.2 pg (ref 26.0–34.0)
MCHC: 32.7 g/dL (ref 30.0–36.0)
MCV: 86 fL (ref 78.0–100.0)
MONO ABS: 0.6 10*3/uL (ref 0.1–1.0)
Monocytes Relative: 11 %
NEUTROS ABS: 2.6 10*3/uL (ref 1.7–7.7)
NEUTROS PCT: 47 %
PLATELETS: 541 10*3/uL — AB (ref 150–400)
RBC: 3.87 MIL/uL (ref 3.87–5.11)
RDW: 15.2 % (ref 11.5–15.5)
WBC: 5.6 10*3/uL (ref 4.0–10.5)

## 2018-04-30 LAB — HEMOGLOBIN A1C
Hgb A1c MFr Bld: 13.1 % — ABNORMAL HIGH (ref 4.8–5.6)
MEAN PLASMA GLUCOSE: 329 mg/dL

## 2018-04-30 LAB — BASIC METABOLIC PANEL
ANION GAP: 10 (ref 5–15)
BUN: 11 mg/dL (ref 6–20)
CO2: 27 mmol/L (ref 22–32)
Calcium: 8.7 mg/dL — ABNORMAL LOW (ref 8.9–10.3)
Chloride: 102 mmol/L (ref 98–111)
Creatinine, Ser: 0.73 mg/dL (ref 0.44–1.00)
Glucose, Bld: 233 mg/dL — ABNORMAL HIGH (ref 70–99)
Potassium: 2.8 mmol/L — ABNORMAL LOW (ref 3.5–5.1)
SODIUM: 139 mmol/L (ref 135–145)

## 2018-04-30 LAB — URINE CULTURE: Culture: >=100000 — AB

## 2018-04-30 LAB — GLUCOSE, CAPILLARY
GLUCOSE-CAPILLARY: 208 mg/dL — AB (ref 70–99)
GLUCOSE-CAPILLARY: 266 mg/dL — AB (ref 70–99)
GLUCOSE-CAPILLARY: 182 mg/dL — AB (ref 70–99)
Glucose-Capillary: 244 mg/dL — ABNORMAL HIGH (ref 70–99)
Glucose-Capillary: 275 mg/dL — ABNORMAL HIGH (ref 70–99)
Glucose-Capillary: 314 mg/dL — ABNORMAL HIGH (ref 70–99)

## 2018-04-30 MED ORDER — MYCOPHENOLATE MOFETIL 500 MG PO TABS: 500.0000 mg | ORAL_TABLET | Freq: Two times a day (BID) | ORAL | Status: DC

## 2018-04-30 MED ORDER — POTASSIUM CHLORIDE ER 10 MEQ PO TBCR: 10.0000 meq | EXTENDED_RELEASE_TABLET | Freq: Every day | ORAL | 0 refills | Status: AC

## 2018-04-30 MED ORDER — MAGNESIUM SULFATE 2 GM/50ML IV SOLN
2.0000 g | Freq: Once | INTRAVENOUS | Status: AC
  Administered 2018-04-30: 2 g via INTRAVENOUS
  Filled 2018-04-30: qty 50

## 2018-04-30 MED ORDER — POTASSIUM CHLORIDE 10 MEQ/100ML IV SOLN
10.0000 meq | INTRAVENOUS | Status: AC
  Administered 2018-04-30 (×4): 10 meq via INTRAVENOUS
  Filled 2018-04-30 (×4): qty 100

## 2018-04-30 MED ORDER — INSULIN ASPART 100 UNIT/ML ~~LOC~~ SOLN
0.0000 [IU] | Freq: Three times a day (TID) | SUBCUTANEOUS | Status: DC
  Administered 2018-04-30: 11 [IU] via SUBCUTANEOUS
  Administered 2018-04-30: 15 [IU] via SUBCUTANEOUS

## 2018-04-30 MED ORDER — INSULIN ASPART 100 UNIT/ML ~~LOC~~ SOLN: 0.0000 [IU] | Freq: Every day | SUBCUTANEOUS | Status: DC

## 2018-04-30 MED ORDER — CEFDINIR 300 MG PO CAPS: 300.0000 mg | ORAL_CAPSULE | Freq: Two times a day (BID) | ORAL | 0 refills | Status: AC

## 2018-04-30 MED ORDER — FLUCONAZOLE 200 MG PO TABS: 200.0000 mg | ORAL_TABLET | Freq: Every day | ORAL | 0 refills | Status: AC

## 2018-04-30 MED ORDER — INSULIN DETEMIR 100 UNIT/ML ~~LOC~~ SOLN
20.0000 [IU] | Freq: Once | SUBCUTANEOUS | Status: AC
  Administered 2018-04-30: 20 [IU] via SUBCUTANEOUS
  Filled 2018-04-30: qty 0.2

## 2018-04-30 NOTE — Progress Notes (Signed)
Patient stated that her insulin pump ran out of insulin around 0400 this morning and that she turned the pump off. This RN notified MD

## 2018-04-30 NOTE — Progress Notes (Signed)
Inpatient Diabetes Program Recommendations  AACE/ADA: New Consensus Statement on Inpatient Glycemic Control (2015)  Target Ranges:  Prepandial:   less than 140 mg/dL      Peak postprandial:   less than 180 mg/dL (1-2 hours)      Critically ill patients:  140 - 180 mg/dL   Lab Results  Component Value Date   GLUCAP 244 (H) 04/30/2018   HGBA1C 13.1 (H) 04/29/2018    Review of Glycemic Control  Pt is out of supplies (insulin pods) for her pump. States no one is able to bring a pod to her, therefore she will need to transition back to SQ insulin. Pt told this Coordinator on 7/10 that she had supplies and wanted to restart pump. This was not the case.  Blood sugars in 200s and pt is eating food brought in from restaurants.  Inpatient Diabetes Program Recommendations:     Novolog 0-20 units tidwc and hs Novolog 6 units tidwc for meal coverage insulin if pt eats > 50% meal Lantus 40 units QAM  Will instruct pt to wait 24H before restarting insulin pump after taking the Lantus.  Have discussed above with RN and pt.  Thank you. Carol Guerra, RD, LDN, CDE Inpatient Diabetes Coordinator [phone]

## 2018-04-30 NOTE — Discharge Summary (Signed)
Physician Discharge Summary  Carol Guerra  ZOX:[id number]  DOB: 10/17/1976  DOA: 04/28/2018 PCP: [facility]  Admit date: 04/28/2018 Discharge date: 04/30/2018  Admitted From: Home  Disposition: Home   Recommendations for Outpatient Follow-up:  1. Follow up with PCP in 1-2 weeks 2. Please obtain BMP/CBC in one week to monitor K and Hgb  3. Please follow up on the following pending results: Final blood cultures, so far negative   Discharge Condition: Stable   CODE STATUS: Full Code  Diet recommendation: Heart Healthy / Carb Modified  Brief/Interim Summary: For full details see H&P/Progress note, but in brief, Carol Guerra is a Carol Guerra is a 42 year old female with medical history significant for diabetes mellitus type 2 insulin-dependent on insulin pump, hypertensionand eczema who presented to the emergency department complaining of urinary frequency abdominal pain nausea vomiting. Upon ED evaluation patient was found to have grossly abnormal UA, hyperglycemia and hyponatremia. Patient was admitted with working diagnosis of sepsis with suspected UTI complicated with AKI.  She was treated with empiric IV antibiotics, urine culture grew E. coli pansensitive, sepsis physiology resolved and renal function came back to normal after IV hydration. Blood glucose was found to be uncontrolled, patient was managed with SSI and Levemir, subsequently placed back on insulin pump. Given clinical improvement patient was deemed stable for discharge to complete PO abx therapy and follow up with PCP.   Subjective: Patient seen and examined, she has no complaints other than noticing oral thrush.  Denies difficulty swallowing.  No chest pain shortness of breath or palpitation.  No difficulty with urination   Discharge Diagnoses/Hospital Course:  Sepsis secondary to UTI Sepsis physiology has resolved, treating underlying causes.  UTI  Urine cultures  grew E. Coli  pansensitive, blood cultures so far negative Patient was initially treated with empiric Rocephin and clinically improving.  Will discharge on Omnicef to complete total of 5 days of antibiotic therapy.  Acute renal failure - resolved In setting of infectious process Treated with IV fluid and renal function went back to normal.  Diabetes mellitus type 2 uncontrolled with hyperglycemia Patient is insulin pump at home, while inpatient was placed on Levemir and insulin sliding scale, metformin was held due to AKI.  Patient transitioned back to her insulin pump.  A1c is 13.1, recommended to follow-up with endocrinology for better glucose control.   Pseudohyponatremia -resolved From hyperglycemia  Hypertension BP stable, Micardis and chlorthalidone were held due to AKI, patient was continued on Coreg.  Will resume home medications with no changes and follow-up with primary care doctor for BP monitoring  Hypokalemia  Repleted IV  Discharge on 5 days supply of oral K 10 mEq, repeat levels in 1 week with PCP   Oral Thrush  Diflucan for 1 week   Hx of Eczema  Recently started on CellCept, this was on hold during hospital stay due to infectious process.  Resume medications in 3 days.  Follow-up with dermatology as an outpatient.  On the day of the discharge the patient's vitals were stable, and no other acute medical condition were reported by patient. the patient was felt safe to be discharge to home.   Discharge Instructions  You were cared for by a hospitalist during your hospital stay. If you have any questions about your discharge medications or the care you received while you were in the hospital after you are discharged, you can call the unit and asked to speak with the hospitalist on call if the hospitalist  that took care of you is not available. Once you are discharged, your primary care physician will handle any further medical issues. Please note that NO REFILLS for any discharge  medications will be authorized once you are discharged, as it is imperative that you return to your primary care physician (or establish a relationship with a primary care physician if you do not have one) for your aftercare needs so that they can reassess your need for medications and monitor your lab values.  Discharge Instructions    Call MD for:  difficulty breathing, headache or visual disturbances   Complete by:  As directed    Call MD for:  extreme fatigue   Complete by:  As directed    Call MD for:  hives   Complete by:  As directed    Call MD for:  persistant dizziness or light-headedness   Complete by:  As directed    Call MD for:  persistant nausea and vomiting   Complete by:  As directed    Call MD for:  redness, tenderness, or signs of infection (pain, swelling, redness, odor or green/yellow discharge around incision site)   Complete by:  As directed    Call MD for:  severe uncontrolled pain   Complete by:  As directed    Call MD for:  temperature >100.4   Complete by:  As directed    Diet - low sodium heart healthy   Complete by:  As directed    Increase activity slowly   Complete by:  As directed      Allergies as of 04/30/2018      Reactions   Amlodipine Anaphylaxis   Canagliflozin Anaphylaxis   Clonidine Derivatives Anaphylaxis      Medication List    TAKE these medications   albuterol (2.5 MG/3ML) 0.083% nebulizer solution Commonly known as:  PROVENTIL Inhale 2.5 mg/39ml by mouth every 6 hours as needed for sob and wheezing   ALPRAZolam 1 MG tablet Commonly known as:  XANAX Take 1 tablet by mouth daily as needed for anxiety   ALTAVERA 0.15-30 MG-MCG tablet Generic drug:  levonorgestrel-ethinyl estradiol Take 1 tablet by mouth daily.   amitriptyline 25 MG tablet Commonly known as:  ELAVIL TK 2 TS PO NIGHTLY   ASPIRIN LOW DOSE 81 MG chewable tablet Generic drug:  aspirin Chew 81 mg by mouth daily.   carvedilol 6.25 MG tablet Commonly known as:   COREG Take 6.25 mg by mouth 2 (two) times daily with a meal.   cefdinir 300 MG capsule Commonly known as:  OMNICEF Take 1 capsule (300 mg total) by mouth 2 (two) times daily for 3 days.   chlorthalidone 25 MG tablet Commonly known as:  HYGROTON Take 25 mg by mouth daily.   COMBIVENT RESPIMAT 20-100 MCG/ACT Aers respimat Generic drug:  Ipratropium-Albuterol INL 1 PUFF ITL QID PRN FOR SOB   FLOVENT HFA 110 MCG/ACT inhaler Generic drug:  fluticasone INL 1 PUFF ITL BID PRN FOR SOB AND WHEEZING   fluconazole 200 MG tablet Commonly known as:  DIFLUCAN Take 1 tablet (200 mg total) by mouth daily for 7 days.   folic acid 1 MG tablet Commonly known as:  FOLVITE Take 1 mg by mouth daily.   hydrOXYzine 25 MG tablet Commonly known as:  ATARAX/VISTARIL Take 25 mg by mouth daily.   metFORMIN 500 MG 24 hr tablet Commonly known as:  GLUCOPHAGE-XR Take 1 tablet by mouth BID   montelukast 10 MG tablet Commonly known  as:  SINGULAIR Take 10 mg by mouth at bedtime as needed (sleep).   mycophenolate 500 MG tablet Commonly known as:  CELLCEPT Take 1 tablet (500 mg total) by mouth 2 (two) times daily. After meals Start taking on:  05/04/2018 What changed:  These instructions start on 05/04/2018. If you are unsure what to do until then, ask your doctor or other care provider.   NOVOLOG 100 UNIT/ML injection Generic drug:  insulin aspart INJECT UP TO 100 UNITS UNDER THE SKIN QD UTD IN INSULIN DELIVERY DEVICE   omeprazole 40 MG capsule Commonly known as:  PRILOSEC Take 40 mg by mouth daily.   potassium chloride 10 MEQ tablet Commonly known as:  K-DUR Take 1 tablet (10 mEq total) by mouth daily for 5 days.   rosuvastatin 20 MG tablet Commonly known as:  CRESTOR TK 1 T PO  D   telmisartan 40 MG tablet Commonly known as:  MICARDIS Take 40 mg by mouth daily.   triamcinolone cream 0.1 % Commonly known as:  KENALOG APP TOPICALLY AA BID FOR 14 DAYS      Follow-up Information     [facility]. Schedule an appointment as soon as possible for a visit in 1 week(s).   Specialty:  Family Medicine Why:  Hospital follow up  Contact information: [address] [facility] [place] [id number] [phone]          Allergies  Allergen Reactions  . Amlodipine Anaphylaxis  . Canagliflozin Anaphylaxis  . Clonidine Derivatives Anaphylaxis    Consultations: None  Procedures/Studies: Dg Chest 2 View  Result Date: 04/28/2018 CLINICAL DATA:  Mental status change, instability, oral ulcers, difficulty urinating. History of diabetes and hypertension. EXAM: CHEST - 2 VIEW COMPARISON:  None in PACs FINDINGS: The lungs are mildly hypoinflated but clear. The heart and pulmonary vascularity are normal. The mediastinum is normal in width. The trachea is midline. The bony thorax is unremarkable. IMPRESSION: There is no pneumonia, CHF, nor other acute cardiopulmonary abnormality. Electronically Signed   By: David  SwazilandJordan M.D.   On: 04/28/2018 14:11     Discharge Exam: Vitals:   04/29/18 2049 04/30/18 0349  BP: (!) 158/93 125/79  Pulse: 97 100  Resp: 20 (!) 21  Temp: 98.6 F (37 C) 98.4 F (36.9 C)  SpO2: 100% 97%   Vitals:   04/29/18 0538 04/29/18 1341 04/29/18 2049 04/30/18 0349  BP: 115/73 140/82 (!) 158/93 125/79  Pulse: (!) 101 (!) 103 97 100  Resp: (!) 24 17 20  (!) 21  Temp: 98.5 F (36.9 C) 98 F (36.7 C) 98.6 F (37 C) 98.4 F (36.9 C)  TempSrc: Oral  Oral Oral  SpO2: 97% 100% 100% 97%  Weight:      Height:        General: Pt is alert, awake, not in acute distress Cardiovascular: RRR, S1/S2 +, no rubs, no gallops Respiratory: CTA bilaterally, no wheezing, no rhonchi Abdominal: Soft, NT, ND, bowel sounds + Extremities: no edema, no cyanosis  The results of significant diagnostics from this hospitalization (including imaging, microbiology, ancillary and laboratory) are listed below for reference.     Microbiology: Recent  Results (from the past 240 hour(s))  Culture, blood (routine x 2)     Status: None (Preliminary result)   Collection Time: 04/28/18  1:30 PM  Result Value Ref Range Status   Specimen Description   Final    BLOOD LEFT ANTECUBITAL Performed at [facility], [address] [place], [place] [address]    Special Requests   Final    BOTTLES DRAWN AEROBIC AND ANAEROBIC Blood Culture adequate volume Performed at [facility], [address] [place], [place] [address]    Culture   Final    NO GROWTH 2 DAYS Performed at [facility], [address] [place], [place] [address]    Report Status PENDING  Incomplete  Urine culture     Status: Abnormal   Collection Time: 04/28/18  1:36 PM  Result Value Ref Range Status   Specimen Description   Final    URINE, RANDOM Performed at [facility], [address] [place], [place] [address]    Special Requests   Final    NONE Performed at [facility], [address] [place], [place] [address]    Culture >=100,000 COLONIES/mL ESCHERICHIA COLI (A)  Final   Report Status 04/30/2018 FINAL  Final   Organism ID, Bacteria ESCHERICHIA COLI (A)  Final      Susceptibility   Escherichia coli - MIC*    AMPICILLIN 8 SENSITIVE Sensitive     CEFAZOLIN <=4 SENSITIVE Sensitive     CEFTRIAXONE <=1 SENSITIVE Sensitive     CIPROFLOXACIN <=0.25 SENSITIVE Sensitive     GENTAMICIN <=1 SENSITIVE Sensitive     IMIPENEM <=0.25 SENSITIVE Sensitive     NITROFURANTOIN <=16 SENSITIVE Sensitive     TRIMETH/SULFA <=20 SENSITIVE Sensitive     AMPICILLIN/SULBACTAM <=2 SENSITIVE Sensitive     PIP/TAZO <=4 SENSITIVE Sensitive     Extended ESBL NEGATIVE Sensitive     * >=100,000 COLONIES/mL ESCHERICHIA COLI  Culture, blood (routine x 2)     Status: None (Preliminary result)   Collection Time: 04/28/18  1:51 PM  Result Value Ref Range Status   Specimen Description   Final    BLOOD RIGHT ANTECUBITAL Performed at [facility], [address] [place], [place] [address]    Special Requests   Final    BOTTLES DRAWN AEROBIC AND ANAEROBIC Blood Culture adequate volume Performed at [facility], [address] [place], [place] [address]    Culture   Final    NO GROWTH 2 DAYS Performed at [facility], [address] [place], [place] [address]    Report Status PENDING  Incomplete     Labs: BNP (last 3 results) No results for input(s): BNP in the last 8760 hours. Basic Metabolic Panel: Recent Labs  Lab 04/28/18 1334 04/29/18 0610 04/30/18 0618  NA 128* 135 139  K 3.3* 2.6* 2.8*  CL 77* 94* 102  CO2 36* 29 27  GLUCOSE 452* 341* 233*  BUN 16 17 11   CREATININE 1.37* 0.89 0.73  CALCIUM 9.9 8.5* 8.7*  MG  --  2.1  --    Liver Function Tests: Recent Labs  Lab 04/28/18 1334 04/29/18 0610  AST 18 15  ALT 11 10  ALKPHOS 142* 102  BILITOT 0.5 0.3  PROT 9.8* 7.3  ALBUMIN 4.4 3.4*   No results for input(s): LIPASE, AMYLASE in the last 168 hours. No results for input(s): AMMONIA in the last 168 hours. CBC: Recent Labs  Lab 04/28/18 1334 04/29/18 0610 04/30/18 0618  WBC 9.5 7.8 5.6  NEUTROABS 6.7  --  2.6  HGB 14.8 12.2 10.9*  HCT 42.8 37.5 33.3*  MCV 82.8 86.8 86.0  PLT 654* 602* 541*   Cardiac Enzymes:  No results for input(s): CKTOTAL, CKMB, CKMBINDEX, TROPONINI in the last 168 hours. BNP: Invalid input(s): POCBNP CBG: Recent Labs  Lab 04/30/18 0051 04/30/18 0350 04/30/18 0731 04/30/18 0926 04/30/18 1131  GLUCAP 275* 208* 244* 314* 266*   D-Dimer No results for input(s): DDIMER in the last 72 hours. Hgb A1c Recent Labs    04/29/18 0610  HGBA1C 13.1*   Lipid Profile No results for input(s): CHOL, HDL, LDLCALC, TRIG, CHOLHDL, LDLDIRECT in the last 72 hours. Thyroid function studies No results for input(s): TSH, T4TOTAL, T3FREE, THYROIDAB in the last 72 hours.  Invalid input(s): FREET3 Anemia work up No results for input(s): VITAMINB12,  FOLATE, FERRITIN, TIBC, IRON, RETICCTPCT in the last 72 hours. Urinalysis    Component Value Date/Time   COLORURINE YELLOW 04/28/2018 1336   APPEARANCEUR CLOUDY (A) 04/28/2018 1336   LABSPEC 1.020 04/28/2018 1336   PHURINE 5.5 04/28/2018 1336   GLUCOSEU >=500 (A) 04/28/2018 1336   HGBUR LARGE (A) 04/28/2018 1336   BILIRUBINUR SMALL (A) 04/28/2018 1336   KETONESUR 15 (A) 04/28/2018 1336   PROTEINUR >300 (A) 04/28/2018 1336   NITRITE POSITIVE (A) 04/28/2018 1336   LEUKOCYTESUR SMALL (A) 04/28/2018 1336   Sepsis Labs Invalid input(s): PROCALCITONIN,  WBC,  LACTICIDVEN Microbiology Recent Results (from the past 240 hour(s))  Culture, blood (routine x 2)     Status: None (Preliminary result)   Collection Time: 04/28/18  1:30 PM  Result Value Ref Range Status   Specimen Description   Final    BLOOD LEFT ANTECUBITAL Performed at [facility], [address] [facility] [place], [place] [address]    Special Requests   Final    BOTTLES DRAWN AEROBIC AND ANAEROBIC Blood Culture adequate volume Performed at [facility], [address] [place], [place] [address]    Culture   Final    NO GROWTH 2 DAYS Performed at [facility], [address] [place], [place] [address]    Report Status PENDING  Incomplete  Urine culture     Status: Abnormal   Collection Time: 04/28/18  1:36 PM  Result Value Ref Range Status   Specimen Description   Final    URINE, RANDOM Performed at [facility], [address] [place], [place] [address]    Special Requests   Final    NONE Performed at [facility], [address] [place], [place] [address]    Culture >=100,000 COLONIES/mL ESCHERICHIA COLI (A)  Final   Report Status 04/30/2018 FINAL  Final   Organism ID, Bacteria ESCHERICHIA COLI (A)  Final      Susceptibility   Escherichia coli - MIC*    AMPICILLIN 8 SENSITIVE Sensitive     CEFAZOLIN <=4 SENSITIVE Sensitive     CEFTRIAXONE <=1 SENSITIVE Sensitive      CIPROFLOXACIN <=0.25 SENSITIVE Sensitive     GENTAMICIN <=1 SENSITIVE Sensitive     IMIPENEM <=0.25 SENSITIVE Sensitive     NITROFURANTOIN <=16 SENSITIVE Sensitive     TRIMETH/SULFA <=20 SENSITIVE Sensitive     AMPICILLIN/SULBACTAM <=2 SENSITIVE Sensitive     PIP/TAZO <=4 SENSITIVE Sensitive     Extended ESBL NEGATIVE Sensitive     * >=100,000 COLONIES/mL ESCHERICHIA COLI  Culture, blood (routine x 2)     Status: None (Preliminary result)   Collection Time: 04/28/18  1:51 PM  Result Value Ref Range Status   Specimen Description   Final    BLOOD RIGHT ANTECUBITAL  Performed at [facility], [address] [place], [place] [address]    Special Requests   Final    BOTTLES DRAWN AEROBIC AND ANAEROBIC Blood Culture adequate volume Performed at [facility], [address] [place], [place] [address]    Culture   Final    NO GROWTH 2 DAYS Performed at [facility], [address], [place], [place] [address]    Report Status PENDING  Incomplete     Time coordinating discharge: 35 minutes  SIGNED:  Latrelle Dodrill, MD  [facility] 04/30/2018, 12:05 PM  Pager please text page via  [email or web address]  Note - This record has been created using [employer]. Chart creation errors have been sought, but may not always have been located. Such creation errors do not reflect on the standard of medical care.

## 2018-05-04 LAB — CULTURE, BLOOD (ROUTINE X 2)
CULTURE: NO GROWTH
Culture: NO GROWTH
SPECIAL REQUESTS: ADEQUATE
Special Requests: ADEQUATE

## 2020-01-05 IMAGING — DX DG CHEST 2V
2 series · 2 of 2 positions shown · non-contrast
Comparison: None in PACs

CLINICAL DATA: Mental status change, instability, oral ulcers,
difficulty urinating. History of diabetes and hypertension.

EXAM:
CHEST - 2 VIEW

[chest lat]
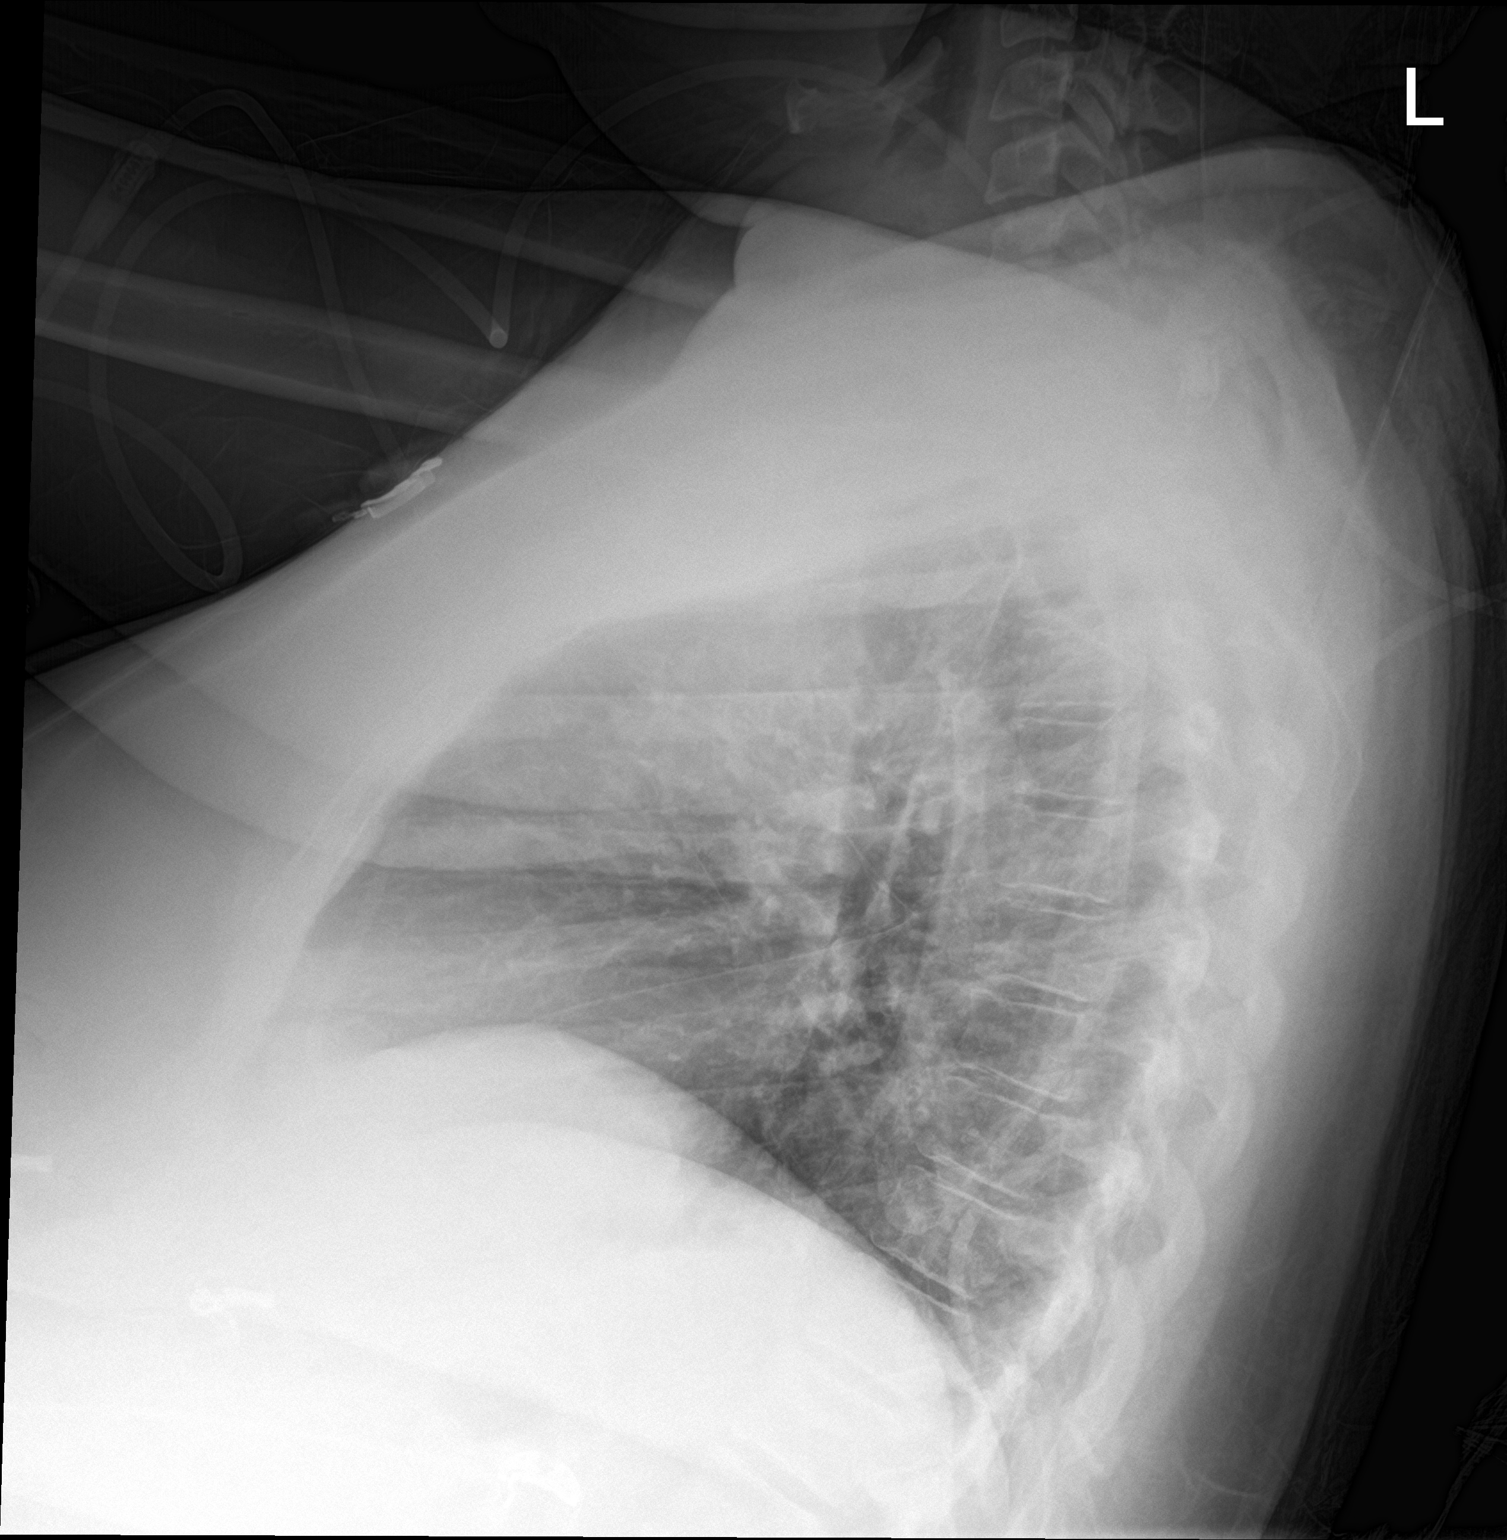

[chest ap strecther]
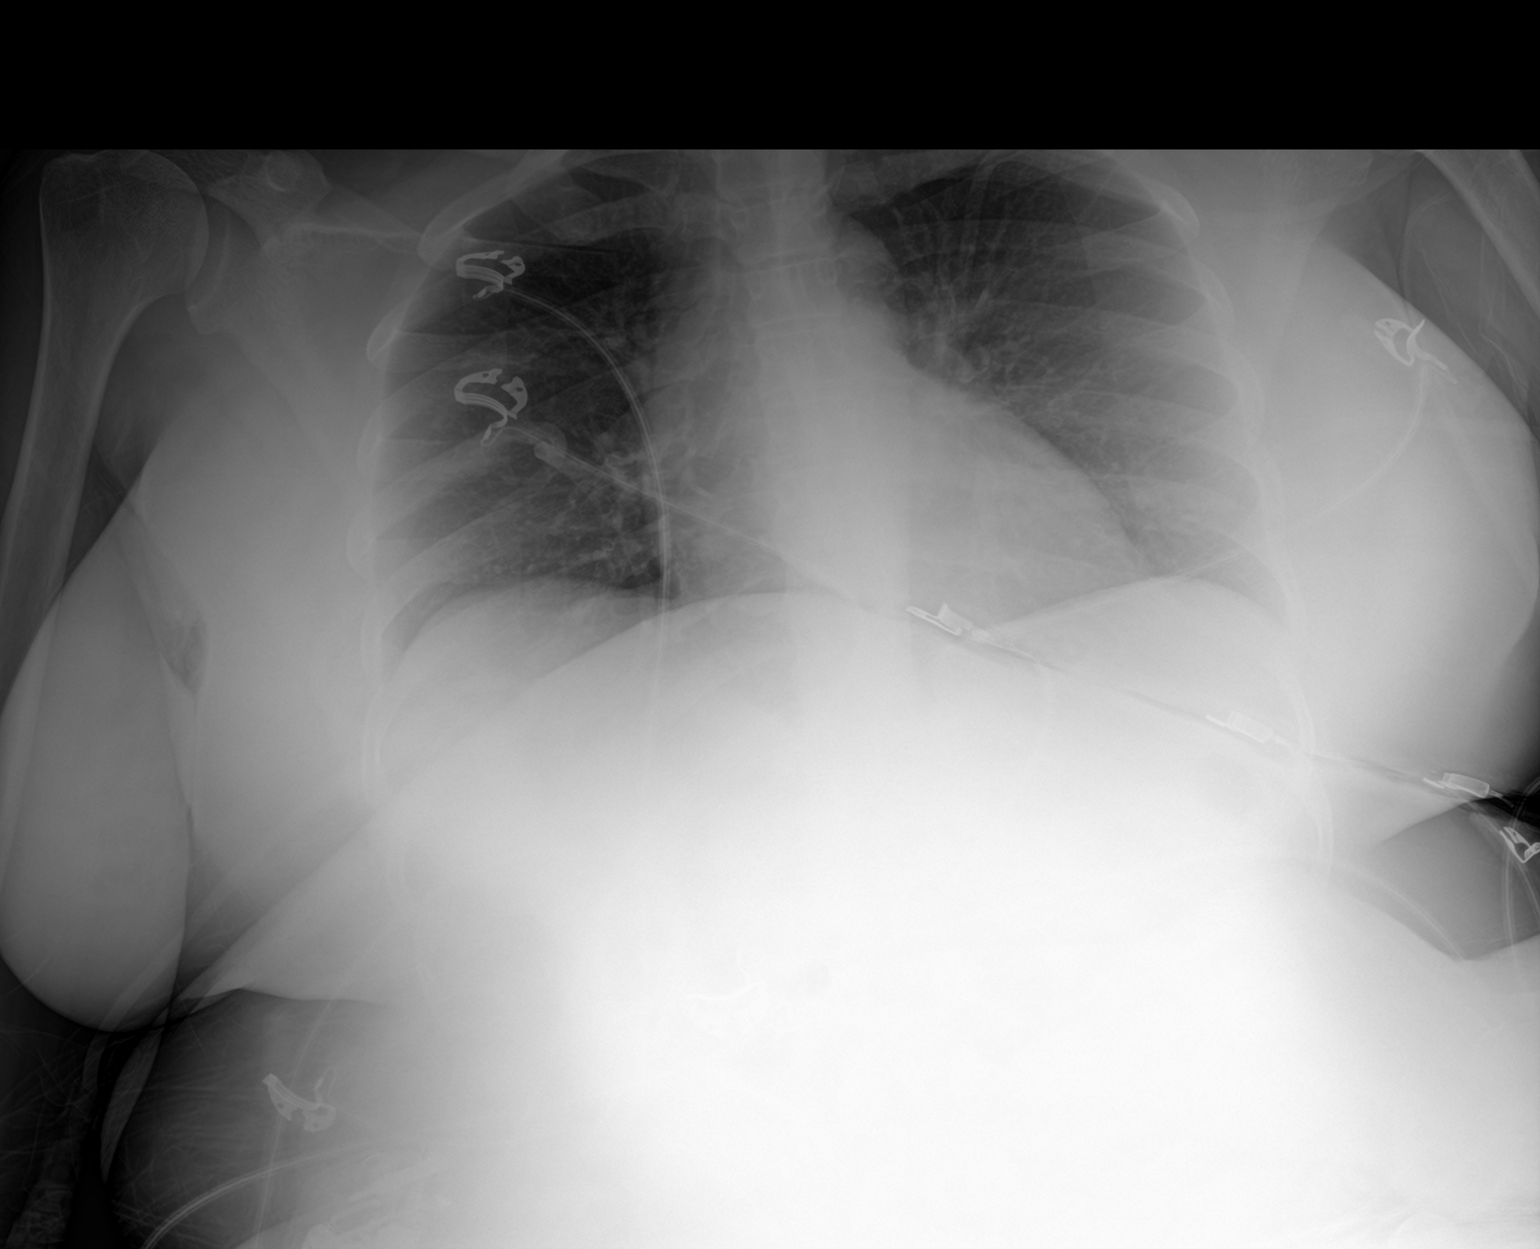

[2 of 2 positions shown; findings below may reference images not displayed]

FINDINGS: The lungs are mildly hypoinflated but clear. The heart and pulmonary
vascularity are normal. The mediastinum is normal in width. The
trachea is midline. The bony thorax is unremarkable.
IMPRESSION: There is no pneumonia, CHF, nor other acute cardiopulmonary
abnormality.

## 2023-01-22 ENCOUNTER — Emergency Department (HOSPITAL_COMMUNITY): Payer: BLUE CROSS/BLUE SHIELD

## 2023-01-22 ENCOUNTER — Other Ambulatory Visit: Payer: Self-pay

## 2023-01-22 ENCOUNTER — Inpatient Hospital Stay (HOSPITAL_COMMUNITY)
Admission: EM | Admit: 2023-01-22 | Discharge: 2023-01-23 | DRG: 439 | Disposition: A | Discharge status: Home / Self Care | Payer: BLUE CROSS/BLUE SHIELD | Attending: Internal Medicine | Admitting: Internal Medicine

## 2023-01-22 DIAGNOSIS — R739 Hyperglycemia, unspecified: Secondary | ICD-10-CM

## 2023-01-22 DIAGNOSIS — E871 Hypo-osmolality and hyponatremia: Secondary | ICD-10-CM | POA: Diagnosis present

## 2023-01-22 DIAGNOSIS — I10 Essential (primary) hypertension: Secondary | ICD-10-CM | POA: Diagnosis present

## 2023-01-22 DIAGNOSIS — E860 Dehydration: Secondary | ICD-10-CM | POA: Diagnosis present

## 2023-01-22 DIAGNOSIS — K853 Drug induced acute pancreatitis without necrosis or infection: Secondary | ICD-10-CM

## 2023-01-22 DIAGNOSIS — Z7982 Long term (current) use of aspirin: Secondary | ICD-10-CM

## 2023-01-22 DIAGNOSIS — Z79624 Long term (current) use of inhibitors of nucleotide synthesis: Secondary | ICD-10-CM | POA: Diagnosis not present

## 2023-01-22 DIAGNOSIS — Z91148 Patient's other noncompliance with medication regimen for other reason: Secondary | ICD-10-CM

## 2023-01-22 DIAGNOSIS — E1143 Type 2 diabetes mellitus with diabetic autonomic (poly)neuropathy: Secondary | ICD-10-CM | POA: Diagnosis present

## 2023-01-22 DIAGNOSIS — K859 Acute pancreatitis without necrosis or infection, unspecified: Secondary | ICD-10-CM | POA: Diagnosis present

## 2023-01-22 DIAGNOSIS — Z79899 Other long term (current) drug therapy: Secondary | ICD-10-CM

## 2023-01-22 DIAGNOSIS — Z7985 Long-term (current) use of injectable non-insulin antidiabetic drugs: Secondary | ICD-10-CM

## 2023-01-22 DIAGNOSIS — K3184 Gastroparesis: Secondary | ICD-10-CM | POA: Diagnosis present

## 2023-01-22 DIAGNOSIS — E119 Type 2 diabetes mellitus without complications: Secondary | ICD-10-CM

## 2023-01-22 DIAGNOSIS — E781 Pure hyperglyceridemia: Secondary | ICD-10-CM | POA: Diagnosis present

## 2023-01-22 DIAGNOSIS — Z794 Long term (current) use of insulin: Secondary | ICD-10-CM | POA: Diagnosis not present

## 2023-01-22 DIAGNOSIS — E1165 Type 2 diabetes mellitus with hyperglycemia: Secondary | ICD-10-CM | POA: Diagnosis present

## 2023-01-22 DIAGNOSIS — N179 Acute kidney failure, unspecified: Secondary | ICD-10-CM | POA: Diagnosis present

## 2023-01-22 DIAGNOSIS — Z9641 Presence of insulin pump (external) (internal): Secondary | ICD-10-CM | POA: Diagnosis present

## 2023-01-22 DIAGNOSIS — K858 Other acute pancreatitis without necrosis or infection: Secondary | ICD-10-CM | POA: Diagnosis not present

## 2023-01-22 DIAGNOSIS — Z7951 Long term (current) use of inhaled steroids: Secondary | ICD-10-CM | POA: Diagnosis not present

## 2023-01-22 DIAGNOSIS — E7800 Pure hypercholesterolemia, unspecified: Secondary | ICD-10-CM | POA: Diagnosis present

## 2023-01-22 DIAGNOSIS — R1110 Vomiting, unspecified: Secondary | ICD-10-CM

## 2023-01-22 DIAGNOSIS — Z7984 Long term (current) use of oral hypoglycemic drugs: Secondary | ICD-10-CM | POA: Diagnosis not present

## 2023-01-22 DIAGNOSIS — E876 Hypokalemia: Secondary | ICD-10-CM | POA: Diagnosis present

## 2023-01-22 DIAGNOSIS — R1114 Bilious vomiting: Secondary | ICD-10-CM | POA: Diagnosis not present

## 2023-01-22 LAB — CBC
HCT: 44.3 % (ref 36.0–46.0)
Hemoglobin: 15 g/dL (ref 12.0–15.0)
MCH: 29.8 pg (ref 26.0–34.0)
MCHC: 33.9 g/dL (ref 30.0–36.0)
MCV: 88.1 fL (ref 80.0–100.0)
Platelets: 427 10*3/uL — ABNORMAL HIGH (ref 150–400)
RBC: 5.03 MIL/uL (ref 3.87–5.11)
RDW: 14.8 % (ref 11.5–15.5)
WBC: 11.2 10*3/uL — ABNORMAL HIGH (ref 4.0–10.5)
nRBC: 0 % (ref 0.0–0.2)

## 2023-01-22 LAB — COMPREHENSIVE METABOLIC PANEL
ALT: 31 U/L (ref 0–44)
AST: 43 U/L — ABNORMAL HIGH (ref 15–41)
Albumin: 4.8 g/dL (ref 3.5–5.0)
Alkaline Phosphatase: 132 U/L — ABNORMAL HIGH (ref 38–126)
Anion gap: 19 — ABNORMAL HIGH (ref 5–15)
BUN: 18 mg/dL (ref 6–20)
CO2: 28 mmol/L (ref 22–32)
Calcium: 10.3 mg/dL (ref 8.9–10.3)
Chloride: 85 mmol/L — ABNORMAL LOW (ref 98–111)
Creatinine, Ser: 3 mg/dL — ABNORMAL HIGH (ref 0.44–1.00)
GFR, Estimated: 19 mL/min — ABNORMAL LOW (ref >60)
Glucose, Bld: 505 mg/dL (ref 70–99)
Potassium: 2.8 mmol/L — ABNORMAL LOW (ref 3.5–5.1)
Sodium: 132 mmol/L — ABNORMAL LOW (ref 135–145)
Total Bilirubin: 0.6 mg/dL (ref 0.3–1.2)
Total Protein: 9.9 g/dL — ABNORMAL HIGH (ref 6.5–8.1)

## 2023-01-22 LAB — CBG MONITORING, ED
Glucose-Capillary: 527 mg/dL (ref 70–99)
Glucose-Capillary: 347 mg/dL — ABNORMAL HIGH (ref 70–99)
Glucose-Capillary: 327 mg/dL — ABNORMAL HIGH (ref 70–99)

## 2023-01-22 LAB — GLUCOSE, CAPILLARY
Glucose-Capillary: 250 mg/dL — ABNORMAL HIGH (ref 70–99)
Glucose-Capillary: 101 mg/dL — ABNORMAL HIGH (ref 70–99)
Glucose-Capillary: 75 mg/dL (ref 70–99)
Glucose-Capillary: 84 mg/dL (ref 70–99)

## 2023-01-22 LAB — I-STAT BETA HCG BLOOD, ED (MC, WL, AP ONLY): I-stat hCG, quantitative: <5 m[IU]/mL (ref <5)

## 2023-01-22 LAB — BLOOD GAS, VENOUS
Acid-Base Excess: 6 mmol/L — ABNORMAL HIGH (ref 0.0–2.0)
Bicarbonate: 33.3 mmol/L — ABNORMAL HIGH (ref 20.0–28.0)
O2 Saturation: 20.4 %
Patient temperature: 37
pCO2, Ven: 59 mmHg (ref 44–60)
pH, Ven: 7.36 (ref 7.25–7.43)
pO2, Ven: <31 mmHg — CL (ref 32–45)

## 2023-01-22 LAB — BETA-HYDROXYBUTYRIC ACID: Beta-Hydroxybutyric Acid: 0.13 mmol/L (ref 0.05–0.27)

## 2023-01-22 LAB — LIPASE, BLOOD: Lipase: 127 U/L — ABNORMAL HIGH (ref 11–51)

## 2023-01-22 LAB — MAGNESIUM: Magnesium: 2.5 mg/dL — ABNORMAL HIGH (ref 1.7–2.4)

## 2023-01-22 MED ORDER — ACETAMINOPHEN 650 MG RE SUPP: 650.0000 mg | Freq: Four times a day (QID) | RECTAL | Status: DC | PRN

## 2023-01-22 MED ORDER — DOXEPIN HCL 50 MG PO CAPS
50.0000 mg | ORAL_CAPSULE | Freq: Every day | ORAL | Status: DC
  Administered 2023-01-22: 50 mg via ORAL
  Filled 2023-01-22 (×2): qty 1

## 2023-01-22 MED ORDER — MORPHINE SULFATE (PF) 2 MG/ML IV SOLN
2.0000 mg | INTRAVENOUS | Status: DC | PRN
  Administered 2023-01-22: 2 mg via INTRAVENOUS
  Filled 2023-01-22: qty 1

## 2023-01-22 MED ORDER — ACETAMINOPHEN 325 MG PO TABS: 650.0000 mg | ORAL_TABLET | Freq: Four times a day (QID) | ORAL | Status: DC | PRN

## 2023-01-22 MED ORDER — METOCLOPRAMIDE HCL 5 MG/ML IJ SOLN
10.0000 mg | Freq: Once | INTRAMUSCULAR | Status: AC
  Administered 2023-01-22: 10 mg via INTRAVENOUS
  Filled 2023-01-22: qty 2

## 2023-01-22 MED ORDER — SODIUM CHLORIDE 0.9 % IV BOLUS
1000.0000 mL | Freq: Once | INTRAVENOUS | Status: AC
  Administered 2023-01-22: 1000 mL via INTRAVENOUS

## 2023-01-22 MED ORDER — ONDANSETRON HCL 4 MG PO TABS: 4.0000 mg | ORAL_TABLET | Freq: Four times a day (QID) | ORAL | Status: DC | PRN

## 2023-01-22 MED ORDER — TRAZODONE HCL 50 MG PO TABS: 25.0000 mg | ORAL_TABLET | Freq: Every evening | ORAL | Status: DC | PRN

## 2023-01-22 MED ORDER — ONDANSETRON HCL 4 MG/2ML IJ SOLN: 4.0000 mg | Freq: Four times a day (QID) | INTRAMUSCULAR | Status: DC | PRN

## 2023-01-22 MED ORDER — INSULIN ASPART 100 UNIT/ML IJ SOLN
0.0000 [IU] | INTRAMUSCULAR | Status: DC
  Administered 2023-01-22: 3 [IU] via SUBCUTANEOUS

## 2023-01-22 MED ORDER — ASPIRIN 81 MG PO CHEW
81.0000 mg | CHEWABLE_TABLET | Freq: Every day | ORAL | Status: DC
  Administered 2023-01-22 – 2023-01-23 (×2): 81 mg via ORAL
  Filled 2023-01-22 (×2): qty 1

## 2023-01-22 MED ORDER — ALBUTEROL SULFATE (2.5 MG/3ML) 0.083% IN NEBU: 2.5000 mg | INHALATION_SOLUTION | RESPIRATORY_TRACT | Status: DC | PRN

## 2023-01-22 MED ORDER — ALPRAZOLAM 0.5 MG PO TABS: 1.0000 mg | ORAL_TABLET | Freq: Every day | ORAL | Status: DC | PRN

## 2023-01-22 MED ORDER — MAGNESIUM SULFATE 2 GM/50ML IV SOLN: 2.0000 g | Freq: Once | INTRAVENOUS | Status: DC

## 2023-01-22 MED ORDER — POTASSIUM CHLORIDE 10 MEQ/100ML IV SOLN
10.0000 meq | INTRAVENOUS | Status: AC
  Administered 2023-01-22 (×5): 10 meq via INTRAVENOUS
  Filled 2023-01-22 (×6): qty 100

## 2023-01-22 MED ORDER — SPIRONOLACTONE 25 MG PO TABS
25.0000 mg | ORAL_TABLET | Freq: Every day | ORAL | Status: DC
  Filled 2023-01-22: qty 1

## 2023-01-22 MED ORDER — CARVEDILOL 12.5 MG PO TABS
12.5000 mg | ORAL_TABLET | Freq: Two times a day (BID) | ORAL | Status: DC
  Administered 2023-01-22 – 2023-01-23 (×3): 12.5 mg via ORAL
  Filled 2023-01-22 (×3): qty 1

## 2023-01-22 MED ORDER — ENOXAPARIN SODIUM 30 MG/0.3ML IJ SOSY
30.0000 mg | PREFILLED_SYRINGE | INTRAMUSCULAR | Status: DC
  Administered 2023-01-22 – 2023-01-23 (×2): 30 mg via SUBCUTANEOUS
  Filled 2023-01-22 (×2): qty 0.3

## 2023-01-22 MED ORDER — MORPHINE SULFATE (PF) 4 MG/ML IV SOLN
4.0000 mg | Freq: Once | INTRAVENOUS | Status: AC
  Administered 2023-01-22: 4 mg via INTRAVENOUS
  Filled 2023-01-22: qty 1

## 2023-01-22 MED ORDER — PANTOPRAZOLE SODIUM 40 MG PO TBEC
40.0000 mg | DELAYED_RELEASE_TABLET | Freq: Every day | ORAL | Status: DC
  Administered 2023-01-22 – 2023-01-23 (×2): 40 mg via ORAL
  Filled 2023-01-22 (×2): qty 1

## 2023-01-22 MED ORDER — ORAL CARE MOUTH RINSE: 15.0000 mL | OROMUCOSAL | Status: DC | PRN

## 2023-01-22 MED ORDER — HYDROXYZINE HCL 25 MG PO TABS: 50.0000 mg | ORAL_TABLET | Freq: Three times a day (TID) | ORAL | Status: DC | PRN

## 2023-01-22 MED ORDER — SODIUM CHLORIDE 0.9 % IV SOLN: INTRAVENOUS | Status: DC

## 2023-01-22 MED ORDER — FOLIC ACID 1 MG PO TABS
1.0000 mg | ORAL_TABLET | Freq: Every day | ORAL | Status: DC
  Administered 2023-01-22 – 2023-01-23 (×2): 1 mg via ORAL
  Filled 2023-01-22 (×2): qty 1

## 2023-01-22 MED ORDER — GABAPENTIN 300 MG PO CAPS
600.0000 mg | ORAL_CAPSULE | Freq: Three times a day (TID) | ORAL | Status: DC
  Administered 2023-01-22 – 2023-01-23 (×4): 600 mg via ORAL
  Filled 2023-01-22 (×4): qty 2

## 2023-01-22 MED ORDER — LACTATED RINGERS IV BOLUS
1000.0000 mL | Freq: Once | INTRAVENOUS | Status: AC
  Administered 2023-01-22: 1000 mL via INTRAVENOUS

## 2023-01-22 MED ORDER — ROSUVASTATIN CALCIUM 20 MG PO TABS
20.0000 mg | ORAL_TABLET | Freq: Every day | ORAL | Status: DC
  Administered 2023-01-22 – 2023-01-23 (×2): 20 mg via ORAL
  Filled 2023-01-22 (×2): qty 1

## 2023-01-22 MED ORDER — INSULIN ASPART 100 UNIT/ML IJ SOLN
0.0000 [IU] | INTRAMUSCULAR | Status: DC
  Administered 2023-01-22: 11 [IU] via SUBCUTANEOUS
  Filled 2023-01-22: qty 0.15

## 2023-01-22 NOTE — ED Provider Notes (Signed)
[place] EMERGENCY DEPARTMENT AT [facility] Provider Note   CSN: [id number] Arrival date & time: 01/22/23  [id number]     History  Chief Complaint  Patient presents with   Emesis   Abdominal Pain    Carol Guerra is a 47 y.o. female.  The history is provided by the patient and medical records.  Emesis Associated symptoms: abdominal pain   Abdominal Pain Associated symptoms: vomiting   Carol Guerra is a 47 y.o. female who presents to the Emergency Department complaining of abdominal pain.  She presents to the emergency department complaining of upper abdominal pain, vomiting and diarrhea for 2 days.  Emesis and stool are nonbloody.  She is unsure how many episodes of emesis and diarrhea she has had.  No associated fever, dysuria.  She does complain of severe fatigue and has been unable to get out of bed.  She has a history of hypertension, diabetes, gastroparesis.  She is status postcholecystectomy.      Home Medications Prior to Admission medications   Medication Sig Start Date End Date Taking? Authorizing Provider  albuterol (PROVENTIL) (2.5 MG/3ML) 0.083% nebulizer solution Inhale 2.5 mg/63ml by mouth every 6 hours as needed for sob and wheezing 04/07/18   [provider]  ALPRAZolam Duanne Moron) 1 MG tablet Take 1 tablet by mouth daily as needed for anxiety 04/15/18   [provider]  ALTAVERA 0.15-30 MG-MCG tablet Take 1 tablet by mouth daily.  04/27/18   [provider]  amitriptyline (ELAVIL) 25 MG tablet TK 2 TS PO NIGHTLY 03/14/18   [provider]  ASPIRIN LOW DOSE 81 MG chewable tablet Chew 81 mg by mouth daily.  03/11/18   [provider]  carvedilol (COREG) 6.25 MG tablet Take 6.25 mg by mouth 2 (two) times daily with a meal.  04/07/18   [provider]  chlorthalidone (HYGROTON) 25 MG tablet Take 25 mg by mouth daily.  04/17/18   [provider]  COMBIVENT RESPIMAT 20-100 MCG/ACT AERS respimat INL 1 PUFF ITL  QID PRN FOR SOB 04/07/18   [provider]  FLOVENT HFA 110 MCG/ACT inhaler INL 1 PUFF ITL BID PRN FOR SOB AND WHEEZING 03/03/18   [provider]  folic acid (FOLVITE) 1 MG tablet Take 1 mg by mouth daily.  02/18/18   [provider]  hydrOXYzine (ATARAX/VISTARIL) 25 MG tablet Take 25 mg by mouth daily.  02/02/18   [provider]  metFORMIN (GLUCOPHAGE-XR) 500 MG 24 hr tablet Take 1 tablet by mouth BID 03/20/18   [provider]  montelukast (SINGULAIR) 10 MG tablet Take 10 mg by mouth at bedtime as needed (sleep).  03/03/18   [provider]  mycophenolate (CELLCEPT) 500 MG tablet Take 1 tablet (500 mg total) by mouth 2 (two) times daily. After meals 05/04/18   Patrecia Pour, Christean Grief, MD  NOVOLOG 100 UNIT/ML injection INJECT UP TO 100 UNITS UNDER THE SKIN QD UTD IN INSULIN DELIVERY DEVICE 04/10/18   [provider]  omeprazole (PRILOSEC) 40 MG capsule Take 40 mg by mouth daily.  04/05/18   [provider]  potassium chloride (K-DUR) 10 MEQ tablet Take 1 tablet (10 mEq total) by mouth daily for 5 days. 04/30/18 05/05/18  Doreatha Lew, MD  rosuvastatin (CRESTOR) 20 MG tablet TK 1 T PO  D 02/03/18   [provider]  telmisartan (MICARDIS) 40 MG tablet Take 40 mg by mouth daily.  04/07/18   [provider]  triamcinolone  cream (KENALOG) 0.1 % APP TOPICALLY AA BID FOR 14 DAYS 04/20/18   [provider]      Allergies    Amlodipine, Canagliflozin, and Clonidine derivatives    Review of Systems   Review of Systems  Gastrointestinal:  Positive for abdominal pain and vomiting.  All other systems reviewed and are negative.   Physical Exam Updated Vital Signs BP (!) 120/95   Pulse (!) 108   Temp 98.8 F (37.1 C)   Resp (!) 23   Ht 5\' 6"  (1.676 m)   Wt 97.5 kg   LMP  (LMP Unknown) Comment: negative beta HCG 01/22/23  SpO2 100%   BMI 34.70 kg/m  Physical Exam Vitals and nursing note reviewed.   Constitutional:      Appearance: She is well-developed.  HENT:     Head: Normocephalic and atraumatic.     Mouth/Throat:     Mouth: Mucous membranes are dry.  Cardiovascular:     Rate and Rhythm: Regular rhythm. Tachycardia present.     Heart sounds: No murmur heard. Pulmonary:     Effort: Pulmonary effort is normal. No respiratory distress.     Breath sounds: Normal breath sounds.  Abdominal:     Palpations: Abdomen is soft.     Tenderness: There is no guarding or rebound.     Comments: Moderate upper abdominal tenderness  Musculoskeletal:        General: No tenderness.  Skin:    General: Skin is warm and dry.  Neurological:     Mental Status: She is alert and oriented to person, place, and time.  Psychiatric:        Behavior: Behavior normal.     ED Results / Procedures / Treatments   Labs (all labs ordered are listed, but only abnormal results are displayed) Labs Reviewed  LIPASE, BLOOD - Abnormal; Notable for the following components:      Result Value   Lipase 127 (*)    All other components within normal limits  COMPREHENSIVE METABOLIC PANEL - Abnormal; Notable for the following components:   Sodium 132 (*)    Potassium 2.8 (*)    Chloride 85 (*)    Glucose, Bld 505 (*)    Creatinine, Ser 3.00 (*)    Total Protein 9.9 (*)    AST 43 (*)    Alkaline Phosphatase 132 (*)    GFR, Estimated 19 (*)    Anion gap 19 (*)    All other components within normal limits  CBC - Abnormal; Notable for the following components:   WBC 11.2 (*)    Platelets 427 (*)    All other components within normal limits  BLOOD GAS, VENOUS - Abnormal; Notable for the following components:   pO2, Ven <31 (*)    Bicarbonate 33.3 (*)    Acid-Base Excess 6.0 (*)    All other components within normal limits  MAGNESIUM - Abnormal; Notable for the following components:   Magnesium 2.5 (*)    All other components within normal limits  CBG MONITORING, ED - Abnormal; Notable for the following  components:   Glucose-Capillary 527 (*)    All other components within normal limits  BETA-HYDROXYBUTYRIC ACID  URINALYSIS, ROUTINE W REFLEX MICROSCOPIC  I-STAT BETA HCG BLOOD, ED ([facility], [facility], [facility] ONLY)    EKG EKG Interpretation  Date/Time:  Wednesday January 22 2023 02:33:47 EDT Ventricular Rate:  115 PR Interval:  145 QRS Duration: 81 QT Interval:  322 QTC Calculation: 446 R  Axis:   -11 Text Interpretation: Sinus tachycardia Probable left atrial enlargement LVH with secondary repolarization abnormality Anteroseptal infarct, old Confirmed by Quintella Reichert [phone]) on 01/22/2023 2:54:15 AM  Radiology CT ABDOMEN PELVIS WO CONTRAST  Result Date: 01/22/2023 CLINICAL DATA:  47 year old female with epigastric and left upper quadrant pain, burning. History of diabetes, gastric paresis. EXAM: CT ABDOMEN AND PELVIS WITHOUT CONTRAST TECHNIQUE: Multidetector CT imaging of the abdomen and pelvis was performed following the standard protocol without IV contrast. RADIATION DOSE REDUCTION: This exam was performed according to the departmental dose-optimization program which includes automated exposure control, adjustment of the mA and/or kV according to patient size and/or use of iterative reconstruction technique. COMPARISON:  [facility] CT Abdomen and Pelvis 10/11/2020. FINDINGS: Lower chest: Negative.  No pericardial or pleural effusion. Hepatobiliary: Chronically absent gallbladder. Small benign dystrophic calcification in the right hepatic lobe unchanged since 2021. Background mild noncontrast liver parenchyma heterogeneity probably due to a degree of steatosis. Pancreas: Mild inflammatory stranding and thickening of the left lateral conal fascia at the tail and distal body of the pancreas. See series 2, images 29 and 33. See also coronal image 53. No pancreatic ductal dilatation is evident. No pancreatic mass is evident on this noncontrast exam. Spleen: Negative. Adrenals/Urinary Tract:  Negative. No urinary calculus or collecting system enlargement. Stomach/Bowel: Large bowel somewhat redundant but decompressed throughout the abdomen and pelvis. Normal appendix suspected on coronal image 48. No dilated small bowel. Intermittent fluid containing loops. Terminal ileum within normal limits. Stomach also relatively decompressed. No gastroduodenal inflammation evident on this noncontrast exam. No free air or free fluid identified. Small scattered mesenteric calcifications may be postinflammatory calcified mesenteric lymph nodes, such as on coronal images 51, 46. These are chronic and stable. Vascular/Lymphatic: Normal caliber abdominal aorta. Vascular patency is not evaluated in the absence of IV contrast. No calcified atherosclerosis or lymphadenopathy identified. Reproductive: Chronic fibroid uterus. Multiple partially calcified fibroids, individually about 3 cm, including subserosal lesions such as at the right uterine body series 2, image 76. Negative noncontrast ovaries. Other: No pelvis free fluid. Right ventral abdominal wall insulin pump type device. Musculoskeletal: No acute osseous abnormality identified. Ventral lower abdominal wall subcutaneous stranding is probably injection site related. Small volume soft tissue gas there in 2021. IMPRESSION: 1. Noncontrast CT appearance suspicious for Acute Pancreatitis, with mild inflammatory stranding at the distal pancreatic body and tail. Thickening of the adjacent left lateral conal fascia. No free fluid. 2. No other acute or inflammatory process identified in the noncontrast abdomen or pelvis. Decompressed bowel. 3. Chronic fibroid uterus. Electronically Signed   By: Genevie Ann M.D.   On: 01/22/2023 04:30    Procedures Procedures    Medications Ordered in ED Medications  potassium chloride 10 mEq in 100 mL IVPB (10 mEq Intravenous New Bag/Given 01/22/23 0525)  sodium chloride 0.9 % bolus 1,000 mL (1,000 mLs Intravenous New Bag/Given 01/22/23 [id number])   morphine (PF) 4 MG/ML injection 4 mg (4 mg Intravenous Given 01/22/23 0313)  metoCLOPramide (REGLAN) injection 10 mg (10 mg Intravenous Given 01/22/23 0312)  lactated ringers bolus 1,000 mL (1,000 mLs Intravenous New Bag/Given 01/22/23 0525)    ED Course/ Medical Decision Making/ A&P                             Medical Decision Making Amount and/or Complexity of Data Reviewed Labs: ordered. Radiology: ordered.  Risk Prescription drug management. Decision regarding hospitalization.  Patient with history of diabetes, hypertension, gastroparesis here for evaluation of abdominal pain, vomiting, diarrhea.  She is dehydrated and tachycardic appearing on evaluation.  Labs significant for hyperglycemia, hypokalemia, AKI with creatinine to 3 with baseline of 0.9 in January of this year on record review in care everywhere.  She does have elevation in her lipase to 127.  A CT abdomen pelvis without contrast was obtained, which is concerning for acute pancreatitis.  She does have improvement in her pain after pain medication administration in the emergency department.  She does have ongoing tachycardia and continues to appear dehydrated despite IV fluids.  Will provide additional fluids.  Discussed with patient findings of studies and recommendation for admission for ongoing care and she is in agreement with treatment plan.  Hospitalist consulted for admission.  Labs not consistent with DKA.        Final Clinical Impression(s) / ED Diagnoses Final diagnoses:  Acute pancreatitis, unspecified complication status, unspecified pancreatitis type  AKI (acute kidney injury)  Hyperglycemia    Rx / DC Orders ED Discharge Orders     None         Quintella Reichert, MD 01/22/23 [phone]

## 2023-01-22 NOTE — Progress Notes (Signed)
  Transition of Care [facility]) Screening Note   Patient Details  Name: Carol Guerra Date of Birth: [birth year]   Transition of Care [facility]) CM/SW Contact:    Dessa Phi, RN Phone Number: 01/22/2023, 2:44 PM    Transition of Care Department [facility]) has reviewed patient and no TOC needs have been identified at this time. We will continue to monitor patient advancement through interdisciplinary progression rounds. If new patient transition needs arise, please place a TOC consult.

## 2023-01-22 NOTE — ED Triage Notes (Signed)
BIBA from home c/o epigastric/LUQ pain, n/v/d since Sunday. Hx of gastroparesis. Pt ran out of nexium Sunday. Tried generic brand without relief. C/o "burning" sensation to abdomen, which was slightly relieved by ice. Pt has had decreased PO intake. C/o feeling weak. 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 CGM hasn't been working properly the past couple days.  EMS vitals: (orthostatic) laying 130/90, standing 110/palp. HR elevated into 130s upon standing, CBG 496  Received 800 mL LR, 50 mcg fentanyl, 4 mg zofran per EMS

## 2023-01-22 NOTE — ED Notes (Signed)
ED TO INPATIENT HANDOFF REPORT  Name/Age/Gender Carol Guerra 47 y.o. female  Code Status    Code Status Orders  (From admission, onward)           Start     Ordered   01/22/23 0849  Full code  Continuous       Question:  By:  Answer:  Consent: discussion documented in EHR   01/22/23 0848           Code Status History     Date Active Date Inactive Code Status Order ID Comments User Context   04/28/2018 1732 04/30/2018 2015 Full Code [id number]  Aline August, MD Inpatient       Home/SNF/Other Home  Chief Complaint Acute pancreatitis [K85.90]  Level of Care/Admitting Diagnosis ED Disposition     ED Disposition  Admit   Condition  --   [place]: [place] [[id number]]  Level of Care: Progressive [102]  Admit to Progressive based on following criteria: GI, ENDOCRINE disease patients with GI bleeding, acute liver failure or pancreatitis, stable with diabetic ketoacidosis or thyrotoxicosis (hypothyroid) state.  May admit patient to [facility] or [facility] if equivalent level of care is available:: Yes  Covid Evaluation: Asymptomatic - no recent exposure (last 10 days) testing not required  Diagnosis: Acute pancreatitis [577.0.ICD-9-CM]  Admitting Physician: Jackelyn Knife [id number]  Attending Physician: Hollice Gong, Curlene Labrum [id number]  Certification:: I certify this patient will need inpatient services for at least 2 midnights  Estimated Length of Stay: 2          Medical History Past Medical History:  Diagnosis Date   Diabetes mellitus without complication ([place])    High cholesterol    Hypertension     Allergies Allergies  Allergen Reactions   Amlodipine Anaphylaxis   Canagliflozin Anaphylaxis   Clonidine Derivatives Anaphylaxis    IV Location/Drains/Wounds Patient Lines/Drains/Airways Status     Active Line/Drains/Airways     Name Placement date Placement time Site Days   Peripheral IV 01/22/23 20 G  Left Antecubital 01/22/23  0238  Antecubital  less than 1   Peripheral IV 01/22/23 22 G 2.5" Anterior;Right Forearm 01/22/23  0447  Forearm  less than 1            Labs/Imaging Results for orders placed or performed during the hospital encounter of 01/22/23 (from the past 48 hour(s))  Lipase, blood     Status: Abnormal   Collection Time: 01/22/23  2:45 AM  Result Value Ref Range   Lipase 127 (H) 11 - 51 U/L    Comment: Performed at [facility], [place] [address], [place], [place] [address]  Comprehensive metabolic panel     Status: Abnormal   Collection Time: 01/22/23  2:45 AM  Result Value Ref Range   Sodium 132 (L) 135 - 145 mmol/L   Potassium 2.8 (L) 3.5 - 5.1 mmol/L   Chloride 85 (L) 98 - 111 mmol/L   CO2 28 22 - 32 mmol/L   Glucose, Bld 505 (HH) 70 - 99 mg/dL    Comment: CRITICAL RESULT CALLED TO, READ BACK BY AND VERIFIED WITH A. GERBICH, RN 01/22/23 0342 BY K. DAVIS Glucose reference range applies only to samples taken after fasting for at least 8 hours.    BUN 18 6 - 20 mg/dL   Creatinine, Ser 3.00 (H) 0.44 - 1.00 mg/dL   Calcium 10.3 8.9 - 10.3 mg/dL   Total Protein 9.9 (H) 6.5 - 8.1 g/dL  Albumin 4.8 3.5 - 5.0 g/dL   AST 43 (H) 15 - 41 U/L   ALT 31 0 - 44 U/L   Alkaline Phosphatase 132 (H) 38 - 126 U/L   Total Bilirubin 0.6 0.3 - 1.2 mg/dL   GFR, Estimated 19 (L) >60 mL/min    Comment: (NOTE) Calculated using the CKD-EPI Creatinine Equation (2021)    Anion gap 19 (H) 5 - 15    Comment: Performed at [facility], [place] [address], [place], [place] [address]  CBC     Status: Abnormal   Collection Time: 01/22/23  2:45 AM  Result Value Ref Range   WBC 11.2 (H) 4.0 - 10.5 K/uL   RBC 5.03 3.87 - 5.11 MIL/uL   Hemoglobin 15.0 12.0 - 15.0 g/dL   HCT 44.3 36.0 - 46.0 %   MCV 88.1 80.0 - 100.0 fL   MCH 29.8 26.0 - 34.0 pg   MCHC 33.9 30.0 - 36.0 g/dL   RDW 14.8 11.5 - 15.5 %   Platelets 427 (H) 150 - 400 K/uL   nRBC 0.0 0.0 -  0.2 %    Comment: Performed at [facility], [place] [address] [place], [place] [address]  Beta-hydroxybutyric acid     Status: None   Collection Time: 01/22/23  2:45 AM  Result Value Ref Range   Beta-Hydroxybutyric Acid 0.13 0.05 - 0.27 mmol/L    Comment: Performed at [facility], [place] [address] [place], [place] [address]  Magnesium     Status: Abnormal   Collection Time: 01/22/23  2:45 AM  Result Value Ref Range   Magnesium 2.5 (H) 1.7 - 2.4 mg/dL    Comment: Performed at [facility], [place] [address], [place], [place] [address]  I-Stat beta hCG blood, ED     Status: None   Collection Time: 01/22/23  2:57 AM  Result Value Ref Range   I-stat hCG, quantitative <5.0 <5 mIU/mL   Comment 3            Comment:   GEST. AGE      CONC.  (mIU/mL)   <=1 WEEK        5 - 50     2 WEEKS       50 - 500     3 WEEKS       100 - 10,000     4 WEEKS     1,000 - 30,000        FEMALE AND NON-PREGNANT FEMALE:     LESS THAN 5 mIU/mL   CBG monitoring, ED     Status: Abnormal   Collection Time: 01/22/23  3:19 AM  Result Value Ref Range   Glucose-Capillary 527 (HH) 70 - 99 mg/dL    Comment: Glucose reference range applies only to samples taken after fasting for at least 8 hours.   Comment 1 Notify RN   Blood gas, venous     Status: Abnormal   Collection Time: 01/22/23  4:50 AM  Result Value Ref Range   pH, Ven 7.36 7.25 - 7.43   pCO2, Ven 59 44 - 60 mmHg   pO2, Ven <31 (LL) 32 - 45 mmHg    Comment: CRITICAL RESULT CALLED TO, READ BACK BY AND VERIFIED WITH: I BLAIR, RN 01/22/23 0504 BY K. DAVIS    Bicarbonate 33.3 (H) 20.0 - 28.0 mmol/L   Acid-Base Excess 6.0 (H) 0.0 - 2.0 mmol/L   O2 Saturation 20.4 %  Patient temperature 37.0     Comment: Performed at [facility], [place] [address] [place], [place] [address]  CBG monitoring, ED     Status: Abnormal   Collection Time: 01/22/23  7:30 AM  Result Value Ref Range    Glucose-Capillary 347 (H) 70 - 99 mg/dL    Comment: Glucose reference range applies only to samples taken after fasting for at least 8 hours.  CBG monitoring, ED     Status: Abnormal   Collection Time: 01/22/23  9:46 AM  Result Value Ref Range   Glucose-Capillary 327 (H) 70 - 99 mg/dL    Comment: Glucose reference range applies only to samples taken after fasting for at least 8 hours.   CT ABDOMEN PELVIS WO CONTRAST  Result Date: 01/22/2023 CLINICAL DATA:  47 year old female with epigastric and left upper quadrant pain, burning. History of diabetes, gastric paresis. EXAM: CT ABDOMEN AND PELVIS WITHOUT CONTRAST TECHNIQUE: Multidetector CT imaging of the abdomen and pelvis was performed following the standard protocol without IV contrast. RADIATION DOSE REDUCTION: This exam was performed according to the departmental dose-optimization program which includes automated exposure control, adjustment of the mA and/or kV according to patient size and/or use of iterative reconstruction technique. COMPARISON:  [facility] CT Abdomen and Pelvis 10/11/2020. FINDINGS: Lower chest: Negative.  No pericardial or pleural effusion. Hepatobiliary: Chronically absent gallbladder. Small benign dystrophic calcification in the right hepatic lobe unchanged since 2021. Background mild noncontrast liver parenchyma heterogeneity probably due to a degree of steatosis. Pancreas: Mild inflammatory stranding and thickening of the left lateral conal fascia at the tail and distal body of the pancreas. See series 2, images 29 and 33. See also coronal image 53. No pancreatic ductal dilatation is evident. No pancreatic mass is evident on this noncontrast exam. Spleen: Negative. Adrenals/Urinary Tract: Negative. No urinary calculus or collecting system enlargement. Stomach/Bowel: Large bowel somewhat redundant but decompressed throughout the abdomen and pelvis. Normal appendix suspected on coronal image 48. No dilated small  bowel. Intermittent fluid containing loops. Terminal ileum within normal limits. Stomach also relatively decompressed. No gastroduodenal inflammation evident on this noncontrast exam. No free air or free fluid identified. Small scattered mesenteric calcifications may be postinflammatory calcified mesenteric lymph nodes, such as on coronal images 51, 46. These are chronic and stable. Vascular/Lymphatic: Normal caliber abdominal aorta. Vascular patency is not evaluated in the absence of IV contrast. No calcified atherosclerosis or lymphadenopathy identified. Reproductive: Chronic fibroid uterus. Multiple partially calcified fibroids, individually about 3 cm, including subserosal lesions such as at the right uterine body series 2, image 76. Negative noncontrast ovaries. Other: No pelvis free fluid. Right ventral abdominal wall insulin pump type device. Musculoskeletal: No acute osseous abnormality identified. Ventral lower abdominal wall subcutaneous stranding is probably injection site related. Small volume soft tissue gas there in 2021. IMPRESSION: 1. Noncontrast CT appearance suspicious for Acute Pancreatitis, with mild inflammatory stranding at the distal pancreatic body and tail. Thickening of the adjacent left lateral conal fascia. No free fluid. 2. No other acute or inflammatory process identified in the noncontrast abdomen or pelvis. Decompressed bowel. 3. Chronic fibroid uterus. Electronically Signed   By: Genevie Ann M.D.   On: 01/22/2023 04:30    Pending Labs Unresulted Labs (From admission, onward)     Start     Ordered   01/29/23 0500  Creatinine, serum  (enoxaparin (LOVENOX)    CrCl >/= 30 ml/min)  Weekly,   R     Comments: while on  enoxaparin therapy    01/22/23 0848   01/23/23 0500  CBC  Daily,   R      01/22/23 0846   01/23/23 [id number]  Basic metabolic panel  Daily,   R      01/22/23 0846   01/23/23 0500  Triglycerides  Tomorrow morning,   R        01/22/23 0911   01/22/23 0903  Hemoglobin A1c   Once,   R       Comments: To assess prior glycemic control    01/22/23 0902   01/22/23 0849  HIV Antibody (routine testing w rflx)  (HIV Antibody (Routine testing w reflex) panel)  Once,   R        01/22/23 0848   01/22/23 0849  CBC  (enoxaparin (LOVENOX)    CrCl >/= 30 ml/min)  Once,   R       Comments: Baseline for enoxaparin therapy IF NOT ALREADY DRAWN.  Notify MD if PLT < 100 K.    01/22/23 0848   01/22/23 [id number]  Basic metabolic panel  Once,   R        01/22/23 0846   01/22/23 0253  Urinalysis, Routine w reflex microscopic -Urine, Clean Catch  Once,   URGENT       Question:  Specimen Source  Answer:  Urine, Clean Catch   01/22/23 0253            Vitals/Pain Today's Vitals   01/22/23 0800 01/22/23 0900 01/22/23 0930 01/22/23 0943  BP: (!) 132/91   (!) 132/91  Pulse: (!) 108 (!) 106 (!) 110 (!) 111  Resp:   18 18  Temp:      TempSrc:      SpO2: 100% 100% 100% 100%  Weight:      Height:      PainSc:        Isolation Precautions No active isolations  Medications Medications  potassium chloride 10 mEq in 100 mL IVPB (0 mEq Intravenous Stopped 01/22/23 0952)  0.9 %  sodium chloride infusion ( Intravenous New Bag/Given 01/22/23 1002)  aspirin chewable tablet 81 mg (81 mg Oral Given 01/22/23 0954)  carvedilol (COREG) tablet 12.5 mg (12.5 mg Oral Given 01/22/23 0953)  rosuvastatin (CRESTOR) tablet 20 mg (20 mg Oral Given 01/22/23 0955)  ALPRAZolam (XANAX) tablet 1 mg (has no administration in time range)  doxepin (SINEQUAN) capsule 50 mg (has no administration in time range)  hydrOXYzine (ATARAX) tablet 50 mg (has no administration in time range)  pantoprazole (PROTONIX) EC tablet 40 mg (40 mg Oral Given [id number])  folic acid (FOLVITE) tablet 1 mg (1 mg Oral Given 01/22/23 1017)  gabapentin (NEURONTIN) capsule 600 mg (600 mg Oral Given 01/22/23 1017)  enoxaparin (LOVENOX) injection 30 mg (30 mg Subcutaneous Given 01/22/23 0956)  acetaminophen (TYLENOL) tablet 650 mg (has no  administration in time range)    Or  acetaminophen (TYLENOL) suppository 650 mg (has no administration in time range)  traZODone (DESYREL) tablet 25 mg (has no administration in time range)  ondansetron (ZOFRAN) tablet 4 mg (has no administration in time range)    Or  ondansetron (ZOFRAN) injection 4 mg (has no administration in time range)  albuterol (PROVENTIL) (2.5 MG/3ML) 0.083% nebulizer solution 2.5 mg (has no administration in time range)  insulin aspart (novoLOG) injection 0-15 Units (11 Units Subcutaneous Given 01/22/23 0959)  morphine (PF) 2 MG/ML injection 2 mg (2 mg Intravenous Given 01/22/23 1018)  sodium chloride 0.9 %  bolus 1,000 mL (0 mLs Intravenous Stopped 01/22/23 0744)  morphine (PF) 4 MG/ML injection 4 mg (4 mg Intravenous Given 01/22/23 0313)  metoCLOPramide (REGLAN) injection 10 mg (10 mg Intravenous Given 01/22/23 0312)  lactated ringers bolus 1,000 mL (0 mLs Intravenous Stopped 01/22/23 0744)    Mobility walks

## 2023-01-22 NOTE — Progress Notes (Signed)
Inpatient Diabetes Program Recommendations  AACE/ADA: New Consensus Statement on Inpatient Glycemic Control (2015)  Target Ranges:  Prepandial:   less than 140 mg/dL      Peak postprandial:   less than 180 mg/dL (1-2 hours)      Critically ill patients:  140 - 180 mg/dL   Lab Results  Component Value Date   GLUCAP 250 (H) 01/22/2023   HGBA1C 13.1 (H) 04/29/2018    Review of Glycemic Control  Diabetes history: DM2 Outpatient Diabetes medications: OmniPod, Jardiance 25 QD, metformin 500 mg BID, Mounjaro 5 mg weekly Current orders for Inpatient glycemic control: Insulin pump + Novolog 0-9 units Q4H  HgbA1C - 13.1% (04/29/18) Updated HgbA1C pending CBGs: 527, 347, 327, 250 mg/dL today  Inpatient Diabetes Program Recommendations:    Basal rate - 2.4 units/H  Bolus - 0  Novolog 0-9 units Q4H.  Tried unsuccessfully to reach Dr Thompson Caul office to get insulin pump rates. Pt states her basal is 2.4 units/H which is 57.6 units/24H period. OmniPod says 82.2 units is total basal/24H. Pt has bolus rate on 0. Not using at present. Pt does not want to take pump off.   Would not restart Mounjaro at discharge d/t pancreatitis.  Discussed above with RN. Insulin pump orders placed. Only using basal portion of pump.   F/U in am.   Thank you. Lorenda Peck, RD, LDN, [place] Inpatient Diabetes Coordinator [phone]

## 2023-01-22 NOTE — H&P (Signed)
History and Physical  Carol Guerra [id number] DOB: 1975/12/17 DOA: 01/22/2023  PCP: [facility]   Chief Complaint: n/v   HPI: Carol Guerra is a 47 y.o. female with medical history significant for insulin-dependent type 2 diabetes with insulin pump, gastroparesis, hypertension and hyperlipidemia being admitted to the hospital with nausea vomiting found to have hyperglycemia and acute pancreatitis.  She came to the ER complaining of 2 days of upper abdominal pain, vomiting and diarrhea.  Emesis and stools are nonbloody.  She denies any fever, dysuria, says she has not been able to keep anything down for the last couple of days.  ED Course:  On initial presentation to the ER, heart rate was 116, blood pressure 139/104, saturating 100% on room air.  Lab work was done, revealed WBC 11,000, normal hemoglobin, platelets 427.  Glucose 505, beta hydroxybutyric acid 0.13.  Sodium 132, potassium 2.8, magnesium 2.5, creatinine 3.0 up from normal baseline, lipase 127.  CT scan was obtained, which shows evidence of acute pancreatitis, see details below.  Patient was admitted to the hospitalist service, kept n.p.o., given 2 L fluid boluses, started on IV potassium replacement.  Currently she is resting comfortably has no complaints of nausea or pain.  Review of Systems: Please see HPI for pertinent positives and negatives. A complete 10 system review of systems are otherwise negative.  Past Medical History:  Diagnosis Date   Diabetes mellitus without complication (HCC)    High cholesterol    Hypertension    Past Surgical History:  Procedure Laterality Date   CHOLECYSTECTOMY      Social History:  reports that she has never smoked. She has never used smokeless tobacco. She reports that she does not drink alcohol and does not use drugs.   Allergies  Allergen Reactions   Amlodipine Anaphylaxis   Canagliflozin Anaphylaxis   Clonidine Derivatives Anaphylaxis    No  family history on file.   Prior to Admission medications   Medication Sig Start Date End Date Taking? Authorizing Provider  albuterol (PROVENTIL) (2.5 MG/3ML) 0.083% nebulizer solution Take 2.5 mg by nebulization every 6 (six) hours as needed for wheezing or shortness of breath. 04/07/18  Yes [provider]  ALPRAZolam Duanne Moron) 1 MG tablet Take 1 mg by mouth daily as needed for anxiety. 04/15/18  Yes [provider]  ASPIRIN LOW DOSE 81 MG chewable tablet Chew 81 mg by mouth daily.  03/11/18  Yes [provider]  carvedilol (COREG) 12.5 MG tablet Take 12.5 mg by mouth 2 (two) times daily with a meal. 11/28/22  Yes [provider]  chlorthalidone (HYGROTON) 25 MG tablet Take 25 mg by mouth daily.  04/17/18  Yes [provider]  COMBIVENT RESPIMAT 20-100 MCG/ACT AERS respimat Inhale 1 puff into the lungs 4 (four) times daily as needed for shortness of breath. 04/07/18  Yes [provider]  doxepin (SINEQUAN) 50 MG capsule Take 50 mg by mouth at bedtime. 09/18/22  Yes [provider]  empagliflozin (JARDIANCE) 25 MG TABS tablet Take 25 mg by mouth daily.   Yes [provider]  FLOVENT HFA 110 MCG/ACT inhaler Inhale 1 puff into the lungs 2 (two) times daily as needed (SOB/Wheezing). 03/03/18  Yes [provider]  folic acid (FOLVITE) 1 MG tablet Take 1 mg by mouth daily.  02/18/18  Yes [provider]  gabapentin (NEURONTIN) 300 MG capsule Take 600 mg by mouth 3 (three) times daily. 10/22/22  Yes [provider]  Ruthell Rummage  KWIKPEN 200 UNIT/ML KwikPen Inject 100 Units into the skin See admin instructions. Per Omnipod 01/21/23  Yes [provider]  hydrOXYzine (ATARAX) 50 MG tablet Take 50 mg by mouth every 8 (eight) hours as needed for itching. 11/11/22  Yes [provider]  metFORMIN (GLUCOPHAGE-XR) 500 MG 24 hr tablet Take 500 mg by mouth in the morning and at bedtime. 03/20/18  Yes [provider]  montelukast (SINGULAIR) 10 MG tablet Take 10 mg by mouth at bedtime as needed (Allergies). 03/03/18  Yes [provider]  MOUNJARO 5 MG/0.5ML Pen Inject 5 mg into the skin once a week. 11/28/22  Yes [provider]  omeprazole (PRILOSEC) 40 MG capsule Take 40 mg by mouth daily.  04/05/18  Yes [provider]  PEGASYS 180 MCG/ML injection Inject 180 mcg into the skin every 7 (seven) days. 12/22/22  Yes [provider]  potassium chloride (K-DUR) 10 MEQ tablet Take 1 tablet (10 mEq total) by mouth daily for 5 days. 04/30/18 01/22/23 Yes Doreatha Lew, MD  rosuvastatin (CRESTOR) 20 MG tablet Take 20 mg by mouth daily. 02/03/18  Yes [provider]  spironolactone (ALDACTONE) 25 MG tablet Take 25 mg by mouth daily. 11/22/22  Yes [provider]  WAL-DRYL ALLERGY 25 MG capsule Take 25 mg by mouth 2 (two) times daily as needed for itching or allergies. 11/28/22  Yes [provider]    Physical Exam: BP (!) 120/95   Pulse (!) 108   Temp 98.2 F (36.8 C) (Oral)   Resp (!) 23   Ht 5\' 6"  (1.676 m)   Wt 97.5 kg   LMP  (LMP Unknown) Comment: negative beta HCG 01/22/23  SpO2 100%   BMI 34.70 kg/m   General:  Alert, oriented, calm, in no acute distress  Eyes: EOMI, clear conjuctivae, white sclerea Neck: supple, no masses, trachea mildline  Cardiovascular: RRR, no murmurs or rubs, no peripheral edema  Respiratory: clear to auscultation bilaterally, no wheezes, no crackles  Abdomen: soft, nontender, nondistended, normal bowel tones heard  Skin: dry, no rashes  Musculoskeletal: no joint effusions, normal range of motion  Psychiatric: appropriate affect, normal speech  Neurologic: extraocular muscles intact, clear speech, moving all extremities with intact sensorium          Labs on Admission:  Basic Metabolic Panel: Recent Labs  Lab 01/22/23 0245  NA 132*  K 2.8*  CL 85*  CO2 28  GLUCOSE 505*  BUN 18  CREATININE 3.00*  CALCIUM  10.3  MG 2.5*   Liver Function Tests: Recent Labs  Lab 01/22/23 0245  AST 43*  ALT 31  ALKPHOS 132*  BILITOT 0.6  PROT 9.9*  ALBUMIN 4.8   Recent Labs  Lab 01/22/23 0245  LIPASE 127*   No results for input(s): "AMMONIA" in the last 168 hours. CBC: Recent Labs  Lab 01/22/23 0245  WBC 11.2*  HGB 15.0  HCT 44.3  MCV 88.1  PLT 427*   Cardiac Enzymes: No results for input(s): "CKTOTAL", "CKMB", "CKMBINDEX", "TROPONINI" in the last 168 hours.  BNP (last 3 results) No results for input(s): "BNP" in the last 8760 hours.  ProBNP (last 3 results) No results for input(s): "PROBNP" in the last 8760 hours.  CBG: Recent Labs  Lab 01/22/23 0319 01/22/23 0730  GLUCAP 527* 347*    Radiological Exams on Admission: CT ABDOMEN PELVIS WO CONTRAST  Result Date: 01/22/2023 CLINICAL DATA:  47 year old female with epigastric and left upper quadrant pain, burning.  History of diabetes, gastric paresis. EXAM: CT ABDOMEN AND PELVIS WITHOUT CONTRAST TECHNIQUE: Multidetector CT imaging of the abdomen and pelvis was performed following the standard protocol without IV contrast. RADIATION DOSE REDUCTION: This exam was performed according to the departmental dose-optimization program which includes automated exposure control, adjustment of the mA and/or kV according to patient size and/or use of iterative reconstruction technique. COMPARISON:  [facility] CT Abdomen and Pelvis 10/11/2020. FINDINGS: Lower chest: Negative.  No pericardial or pleural effusion. Hepatobiliary: Chronically absent gallbladder. Small benign dystrophic calcification in the right hepatic lobe unchanged since 2021. Background mild noncontrast liver parenchyma heterogeneity probably due to a degree of steatosis. Pancreas: Mild inflammatory stranding and thickening of the left lateral conal fascia at the tail and distal body of the pancreas. See series 2, images 29 and 33. See also coronal image 53. No pancreatic  ductal dilatation is evident. No pancreatic mass is evident on this noncontrast exam. Spleen: Negative. Adrenals/Urinary Tract: Negative. No urinary calculus or collecting system enlargement. Stomach/Bowel: Large bowel somewhat redundant but decompressed throughout the abdomen and pelvis. Normal appendix suspected on coronal image 48. No dilated small bowel. Intermittent fluid containing loops. Terminal ileum within normal limits. Stomach also relatively decompressed. No gastroduodenal inflammation evident on this noncontrast exam. No free air or free fluid identified. Small scattered mesenteric calcifications may be postinflammatory calcified mesenteric lymph nodes, such as on coronal images 51, 46. These are chronic and stable. Vascular/Lymphatic: Normal caliber abdominal aorta. Vascular patency is not evaluated in the absence of IV contrast. No calcified atherosclerosis or lymphadenopathy identified. Reproductive: Chronic fibroid uterus. Multiple partially calcified fibroids, individually about 3 cm, including subserosal lesions such as at the right uterine body series 2, image 76. Negative noncontrast ovaries. Other: No pelvis free fluid. Right ventral abdominal wall insulin pump type device. Musculoskeletal: No acute osseous abnormality identified. Ventral lower abdominal wall subcutaneous stranding is probably injection site related. Small volume soft tissue gas there in 2021. IMPRESSION: 1. Noncontrast CT appearance suspicious for Acute Pancreatitis, with mild inflammatory stranding at the distal pancreatic body and tail. Thickening of the adjacent left lateral conal fascia. No free fluid. 2. No other acute or inflammatory process identified in the noncontrast abdomen or pelvis. Decompressed bowel. 3. Chronic fibroid uterus. Electronically Signed   By: Genevie Ann M.D.   On: 01/22/2023 04:30    Assessment/Plan Principal Problem:   Acute pancreatitis - patient presents with abdominal pain, vomiting, elevated  lipase and CT findings suggestive of this diagnosis.  Etiology currently unclear, though I highly suspect her Mounjaro. -Observation admission -N.p.o., with ice chips -Normal saline infusion 150 cc/h -Pain and nausea control as needed -Check fasting triglyceride level -Would recommend holding or even discontinuing Mounjaro    DM2 (diabetes mellitus, type 2), uncontrolled -Continue insulin pump which provides her basal insulin -Will administer NovoLog sliding scale every 4 hours -Discussed with patient that she should not bolus from her pump    Gastroparesis-as needed Zofran   Hypokalemia-she will receive a total of 60 mEq of IV potassium   AKI (acute kidney injury)-this is presumably due to dehydration related to her nausea and vomiting, avoid nephrotoxins, hydrate aggressively as above, and follow renal function in the morning Hyponatremia   Vomiting  DVT prophylaxis: Lovenox     Code Status: Full Code  Consults called: None  Admission status: Observation  Time spent: 43 minutes  Carol Guerra Neva Seat MD [facility] Pager [phone]  If 7PM-7AM, please contact night-coverage [email or web address] Password [email or web address]  01/22/2023, 9:06 AM

## 2023-01-22 NOTE — ED Notes (Signed)
Assumed care of this pt. Pt was sitting in bed with equal chest rise and fall in no obvious distress. Pt requested something to eat and she was informed that she was still NPO and could not have anything yet until cleared by doctor.

## 2023-01-23 ENCOUNTER — Encounter (HOSPITAL_COMMUNITY): Payer: Self-pay

## 2023-01-23 DIAGNOSIS — K3184 Gastroparesis: Secondary | ICD-10-CM

## 2023-01-23 DIAGNOSIS — R1114 Bilious vomiting: Secondary | ICD-10-CM

## 2023-01-23 DIAGNOSIS — E1165 Type 2 diabetes mellitus with hyperglycemia: Secondary | ICD-10-CM

## 2023-01-23 DIAGNOSIS — K858 Other acute pancreatitis without necrosis or infection: Secondary | ICD-10-CM | POA: Diagnosis not present

## 2023-01-23 DIAGNOSIS — N179 Acute kidney failure, unspecified: Secondary | ICD-10-CM | POA: Diagnosis not present

## 2023-01-23 DIAGNOSIS — E876 Hypokalemia: Secondary | ICD-10-CM

## 2023-01-23 DIAGNOSIS — Z794 Long term (current) use of insulin: Secondary | ICD-10-CM

## 2023-01-23 LAB — GLUCOSE, CAPILLARY
Glucose-Capillary: 104 mg/dL — ABNORMAL HIGH (ref 70–99)
Glucose-Capillary: 119 mg/dL — ABNORMAL HIGH (ref 70–99)
Glucose-Capillary: 202 mg/dL — ABNORMAL HIGH (ref 70–99)
Glucose-Capillary: 64 mg/dL — ABNORMAL LOW (ref 70–99)
Glucose-Capillary: 74 mg/dL (ref 70–99)

## 2023-01-23 LAB — BASIC METABOLIC PANEL
Anion gap: 9 (ref 5–15)
BUN: 17 mg/dL (ref 6–20)
CO2: 27 mmol/L (ref 22–32)
Calcium: 8.5 mg/dL — ABNORMAL LOW (ref 8.9–10.3)
Chloride: 99 mmol/L (ref 98–111)
Creatinine, Ser: 1.32 mg/dL — ABNORMAL HIGH (ref 0.44–1.00)
GFR, Estimated: 50 mL/min — ABNORMAL LOW (ref >60)
Glucose, Bld: 212 mg/dL — ABNORMAL HIGH (ref 70–99)
Potassium: 2.7 mmol/L — CL (ref 3.5–5.1)
Sodium: 135 mmol/L (ref 135–145)

## 2023-01-23 LAB — CBC
HCT: 37.7 % (ref 36.0–46.0)
Hemoglobin: 12.3 g/dL (ref 12.0–15.0)
MCH: 29.6 pg (ref 26.0–34.0)
MCHC: 32.6 g/dL (ref 30.0–36.0)
MCV: 90.8 fL (ref 80.0–100.0)
Platelets: 312 10*3/uL (ref 150–400)
RBC: 4.15 MIL/uL (ref 3.87–5.11)
RDW: 15.5 % (ref 11.5–15.5)
WBC: 9.1 10*3/uL (ref 4.0–10.5)
nRBC: 0 % (ref 0.0–0.2)

## 2023-01-23 LAB — HEMOGLOBIN A1C
Hgb A1c MFr Bld: 11.1 % — ABNORMAL HIGH (ref 4.8–5.6)
Mean Plasma Glucose: 272 mg/dL

## 2023-01-23 LAB — HIV ANTIBODY (ROUTINE TESTING W REFLEX): HIV Screen 4th Generation wRfx: NONREACTIVE

## 2023-01-23 LAB — TRIGLYCERIDES: Triglycerides: 336 mg/dL — ABNORMAL HIGH (ref <150)

## 2023-01-23 MED ORDER — POTASSIUM CHLORIDE CRYS ER 20 MEQ PO TBCR
40.0000 meq | EXTENDED_RELEASE_TABLET | Freq: Three times a day (TID) | ORAL | Status: DC
  Administered 2023-01-23: 40 meq via ORAL
  Filled 2023-01-23: qty 2

## 2023-01-23 MED ORDER — ENOXAPARIN SODIUM 40 MG/0.4ML IJ SOSY: 40.0000 mg | PREFILLED_SYRINGE | INTRAMUSCULAR | Status: DC

## 2023-01-23 MED ORDER — ROSUVASTATIN CALCIUM 40 MG PO TABS: 40.0000 mg | ORAL_TABLET | Freq: Every day | ORAL | 0 refills | Status: AC

## 2023-01-23 MED ORDER — POTASSIUM CHLORIDE CRYS ER 20 MEQ PO TBCR
40.0000 meq | EXTENDED_RELEASE_TABLET | ORAL | Status: DC
  Administered 2023-01-23 (×2): 40 meq via ORAL
  Filled 2023-01-23 (×2): qty 2

## 2023-01-23 MED ORDER — DEXTROSE 50 % IV SOLN
12.5000 g | INTRAVENOUS | Status: AC
  Administered 2023-01-23: 12.5 g via INTRAVENOUS
  Filled 2023-01-23: qty 50

## 2023-01-23 NOTE — Progress Notes (Signed)
RN provided discharge paperwork to pt. All questions answered. IV's removed. Tele removed. Pt dressed in personal clothing. Pt calling Melburn Popper for ride home, per pt request.

## 2023-01-23 NOTE — Inpatient Diabetes Management (Addendum)
Inpatient Diabetes Program Recommendations  AACE/ADA: New Consensus Statement on Inpatient Glycemic Control (2015)  Target Ranges:  Prepandial:   less than 140 mg/dL      Peak postprandial:   less than 180 mg/dL (1-2 hours)      Critically ill patients:  140 - 180 mg/dL   Lab Results  Component Value Date   GLUCAP 104 (H) 01/23/2023   HGBA1C 11.1 (H) 01/22/2023    Latest Reference Range & Units 01/22/23 21:46 01/23/23 00:10 01/23/23 04:08 01/23/23 05:22 01/23/23 07:52 01/23/23 11:38  Glucose-Capillary 70 - 99 mg/dL 84 74 64 (L) 202 (H) 119 (H) 104 (H)  (L): Data is abnormally low (H): Data is abnormally high Review of Glycemic Control  Diabetes history: DM2 Outpatient Diabetes medications: Omnipod insulin pump, Jardiance 25 mg daily, Metformin 500 mg BID, Mounjaro 5 mg weekly Current orders for Inpatient glycemic control: Novolog SENSITIVE correction scale every 4 hours  Inpatient Diabetes Program Recommendations:   Spoke with patient at the bedside. Patient has been wearing and using her insulin pump while in the hospital. She states that she told the nurses about it. She states that is why her blood sugars have been low. Noted that insulin pump order set had never been entered into chart. Blood sugars continued to be checked every 4 hours.   Patient states that the insulin has now run out in her pump. It has been discontinued. She will need to be covered by the insulin correction scale as ordered. Patient is waiting to go home today.   Harvel Ricks RN BSN CDE Diabetes Coordinator Pager: [phone]  8am-5pm

## 2023-01-23 NOTE — Discharge Summary (Signed)
Physician Discharge Summary  Kaleesi Silvan [id number] DOB: 1976-02-17 DOA: 01/22/2023  PCP: [facility] [place] date: 01/22/2023 Discharge date: 01/23/2023  Admitted From: Home Disposition: Home  Recommendations for Outpatient Follow-up:  Follow up with PCP in 1-2 weeks Repeat labs in 1 to 2 weeks PCP, defer to their expertise in regards to repeat imaging, likely not necessary as patient continues to improve  Home Health: None Equipment/Devices: None  Discharge Condition: Stable CODE STATUS: Full Diet recommendation:    Brief/Interim Summary: Rilee Vi is a 47 y.o. female with medical history significant for insulin-dependent type 2 diabetes with insulin pump, gastroparesis, hypertension and hyperlipidemia being admitted to the hospital with nausea vomiting found to have hyperglycemia and acute pancreatitis.  Patient admitted as above with acute intractable nausea vomiting abdominal pain hyperglycemia and pancreatitis in the setting of hypertriglyceridemia, questionably exacerbated by GLP-1 agonist and profoundly uncontrolled glucose levels secondary to noncompliance with insulin.  At this time with IV fluids patient's labs have improved drastically, clinically patient appears back to baseline no longer having symptoms of nausea vomiting or GI distress.  Patient otherwise stable and agreeable for discharge home, lengthy discussion about need for close follow-up with endocrinology/PCP for ongoing insulin titration given his elevated A1c is consistent with uncontrolled diabetes with hyperglycemia.   Discharge Diagnoses:  Principal Problem:   Acute pancreatitis Active Problems:   DM2 (diabetes mellitus, type 2)   Gastroparesis   Hypokalemia   AKI (acute kidney injury)   Vomiting   Hypertriglyceridemia   Noncompliance with medications   Discharge Instructions   Allergies as of 01/23/2023       Reactions   Amlodipine Anaphylaxis   Canagliflozin  Anaphylaxis   Clonidine Derivatives Anaphylaxis        Medication List     TAKE these medications    albuterol (2.5 MG/3ML) 0.083% nebulizer solution Commonly known as: PROVENTIL Take 2.5 mg by nebulization every 6 (six) hours as needed for wheezing or shortness of breath.   ALPRAZolam 1 MG tablet Commonly known as: XANAX Take 1 mg by mouth daily as needed for anxiety.   Aspirin Low Dose 81 MG chewable tablet Generic drug: aspirin Chew 81 mg by mouth daily.   carvedilol 12.5 MG tablet Commonly known as: COREG Take 12.5 mg by mouth 2 (two) times daily with a meal.   chlorthalidone 25 MG tablet Commonly known as: HYGROTON Take 25 mg by mouth daily.   Combivent Respimat 20-100 MCG/ACT Aers respimat Generic drug: Ipratropium-Albuterol Inhale 1 puff into the lungs 4 (four) times daily as needed for shortness of breath.   doxepin 50 MG capsule Commonly known as: SINEQUAN Take 50 mg by mouth at bedtime.   Flovent HFA 110 MCG/ACT inhaler Generic drug: fluticasone Inhale 1 puff into the lungs 2 (two) times daily as needed (SOB/Wheezing).   folic acid 1 MG tablet Commonly known as: FOLVITE Take 1 mg by mouth daily.   gabapentin 300 MG capsule Commonly known as: NEURONTIN Take 600 mg by mouth 3 (three) times daily.   HumaLOG KwikPen 200 UNIT/ML KwikPen Generic drug: insulin lispro Inject 100 Units into the skin See admin instructions. Per Omnipod   hydrOXYzine 50 MG tablet Commonly known as: ATARAX Take 50 mg by mouth every 8 (eight) hours as needed for itching.   Jardiance 25 MG Tabs tablet Generic drug: empagliflozin Take 25 mg by mouth daily.   metFORMIN 500 MG 24 hr tablet Commonly known as: GLUCOPHAGE-XR Take 500 mg by mouth  in the morning and at bedtime.   montelukast 10 MG tablet Commonly known as: SINGULAIR Take 10 mg by mouth at bedtime as needed (Allergies).   Mounjaro 5 MG/0.5ML Pen Generic drug: tirzepatide Inject 5 mg into the skin once a  week.   omeprazole 40 MG capsule Commonly known as: PRILOSEC Take 40 mg by mouth daily.   Pegasys 180 MCG/ML injection Generic drug: peginterferon alfa-2a Inject 180 mcg into the skin every 7 (seven) days.   potassium chloride 10 MEQ tablet Commonly known as: KLOR-CON Take 1 tablet (10 mEq total) by mouth daily for 5 days.   rosuvastatin 40 MG tablet Commonly known as: CRESTOR Take 1 tablet (40 mg total) by mouth daily. What changed:  medication strength how much to take   spironolactone 25 MG tablet Commonly known as: ALDACTONE Take 25 mg by mouth daily.   Wal-Dryl Allergy 25 mg capsule Generic drug: diphenhydrAMINE Take 25 mg by mouth 2 (two) times daily as needed for itching or allergies.        Allergies  Allergen Reactions   Amlodipine Anaphylaxis   Canagliflozin Anaphylaxis   Clonidine Derivatives Anaphylaxis    Consultations: None  Procedures/Studies: CT ABDOMEN PELVIS WO CONTRAST  Result Date: 01/22/2023 CLINICAL DATA:  47 year old female with epigastric and left upper quadrant pain, burning. History of diabetes, gastric paresis. EXAM: CT ABDOMEN AND PELVIS WITHOUT CONTRAST TECHNIQUE: Multidetector CT imaging of the abdomen and pelvis was performed following the standard protocol without IV contrast. RADIATION DOSE REDUCTION: This exam was performed according to the departmental dose-optimization program which includes automated exposure control, adjustment of the mA and/or kV according to patient size and/or use of iterative reconstruction technique. COMPARISON:  [facility] CT Abdomen and Pelvis 10/11/2020. FINDINGS: Lower chest: Negative.  No pericardial or pleural effusion. Hepatobiliary: Chronically absent gallbladder. Small benign dystrophic calcification in the right hepatic lobe unchanged since 2021. Background mild noncontrast liver parenchyma heterogeneity probably due to a degree of steatosis. Pancreas: Mild inflammatory stranding and  thickening of the left lateral conal fascia at the tail and distal body of the pancreas. See series 2, images 29 and 33. See also coronal image 53. No pancreatic ductal dilatation is evident. No pancreatic mass is evident on this noncontrast exam. Spleen: Negative. Adrenals/Urinary Tract: Negative. No urinary calculus or collecting system enlargement. Stomach/Bowel: Large bowel somewhat redundant but decompressed throughout the abdomen and pelvis. Normal appendix suspected on coronal image 48. No dilated small bowel. Intermittent fluid containing loops. Terminal ileum within normal limits. Stomach also relatively decompressed. No gastroduodenal inflammation evident on this noncontrast exam. No free air or free fluid identified. Small scattered mesenteric calcifications may be postinflammatory calcified mesenteric lymph nodes, such as on coronal images 51, 46. These are chronic and stable. Vascular/Lymphatic: Normal caliber abdominal aorta. Vascular patency is not evaluated in the absence of IV contrast. No calcified atherosclerosis or lymphadenopathy identified. Reproductive: Chronic fibroid uterus. Multiple partially calcified fibroids, individually about 3 cm, including subserosal lesions such as at the right uterine body series 2, image 76. Negative noncontrast ovaries. Other: No pelvis free fluid. Right ventral abdominal wall insulin pump type device. Musculoskeletal: No acute osseous abnormality identified. Ventral lower abdominal wall subcutaneous stranding is probably injection site related. Small volume soft tissue gas there in 2021. IMPRESSION: 1. Noncontrast CT appearance suspicious for Acute Pancreatitis, with mild inflammatory stranding at the distal pancreatic body and tail. Thickening of the adjacent left lateral conal fascia. No free fluid. 2. No other acute or  inflammatory process identified in the noncontrast abdomen or pelvis. Decompressed bowel. 3. Chronic fibroid uterus. Electronically Signed    By: Genevie Ann M.D.   On: 01/22/2023 04:30     Subjective: No acute issues or events overnight nausea resolved, tolerating lunch without difficulty.  Otherwise requesting discharge home which is certainly reasonable   Discharge Exam: Vitals:   01/23/23 0410 01/23/23 1328  BP: 124/78 117/66  Pulse: (!) 108 95  Resp: 16 15  Temp: 100.3 F (37.9 C) 98 F (36.7 C)  SpO2: 100% 96%   Vitals:   01/22/23 1958 01/23/23 0012 01/23/23 0410 01/23/23 1328  BP: 105/74 106/73 124/78 117/66  Pulse: 95 98 (!) 108 95  Resp: 18 16 16 15   Temp: 98.5 F (36.9 C) 98.5 F (36.9 C) 100.3 F (37.9 C) 98 F (36.7 C)  TempSrc: Oral Oral Oral Oral  SpO2: 99% 98% 100% 96%  Weight:      Height:        General: Pt is alert, awake, not in acute distress Cardiovascular: RRR, S1/S2 +, no rubs, no gallops Respiratory: CTA bilaterally, no wheezing, no rhonchi Abdominal: Soft, NT, ND, bowel sounds + Extremities: no edema, no cyanosis    The results of significant diagnostics from this hospitalization (including imaging, microbiology, ancillary and laboratory) are listed below for reference.     Microbiology: No results found for this or any previous visit (from the past 240 hour(s)).   Labs: BNP (last 3 results) No results for input(s): "BNP" in the last 8760 hours. Basic Metabolic Panel: Recent Labs  Lab 01/22/23 0245 01/23/23 0515  NA 132* 135  K 2.8* 2.7*  CL 85* 99  CO2 28 27  GLUCOSE 505* 212*  BUN 18 17  CREATININE 3.00* 1.32*  CALCIUM 10.3 8.5*  MG 2.5*  --    Liver Function Tests: Recent Labs  Lab 01/22/23 0245  AST 43*  ALT 31  ALKPHOS 132*  BILITOT 0.6  PROT 9.9*  ALBUMIN 4.8   Recent Labs  Lab 01/22/23 0245  LIPASE 127*   No results for input(s): "AMMONIA" in the last 168 hours. CBC: Recent Labs  Lab 01/22/23 0245 01/23/23 0515  WBC 11.2* 9.1  HGB 15.0 12.3  HCT 44.3 37.7  MCV 88.1 90.8  PLT 427* 312   Cardiac Enzymes: No results for input(s):  "CKTOTAL", "CKMB", "CKMBINDEX", "TROPONINI" in the last 168 hours. BNP: Invalid input(s): "POCBNP" CBG: Recent Labs  Lab 01/23/23 0010 01/23/23 0408 01/23/23 0522 01/23/23 0752 01/23/23 1138  GLUCAP 74 64* 202* 119* 104*   D-Dimer No results for input(s): "DDIMER" in the last 72 hours. Hgb A1c Recent Labs    01/22/23 0245  HGBA1C 11.1*   Lipid Profile Recent Labs    01/23/23 0515  TRIG 336*   Thyroid function studies No results for input(s): "TSH", "T4TOTAL", "T3FREE", "THYROIDAB" in the last 72 hours.  Invalid input(s): "FREET3" Anemia work up No results for input(s): "VITAMINB12", "FOLATE", "FERRITIN", "TIBC", "IRON", "RETICCTPCT" in the last 72 hours. Urinalysis    Component Value Date/Time   COLORURINE YELLOW 04/28/2018 1336   APPEARANCEUR CLOUDY (A) 04/28/2018 1336   LABSPEC 1.020 04/28/2018 1336   PHURINE 5.5 04/28/2018 1336   GLUCOSEU >=500 (A) 04/28/2018 1336   HGBUR LARGE (A) 04/28/2018 1336   BILIRUBINUR SMALL (A) 04/28/2018 1336   KETONESUR 15 (A) 04/28/2018 1336   PROTEINUR >300 (A) 04/28/2018 1336   NITRITE POSITIVE (A) 04/28/2018 1336   LEUKOCYTESUR SMALL (A) 04/28/2018 1336  Sepsis Labs Recent Labs  Lab 01/22/23 0245 01/23/23 0515  WBC 11.2* 9.1   Microbiology No results found for this or any previous visit (from the past 240 hour(s)).   Time coordinating discharge: Over 30 minutes  SIGNED:   Little Ishikawa, DO [facility] 01/23/2023, 1:45 PM Pager   If 7PM-7AM, please contact night-coverage [email or web address]

## 2023-01-23 NOTE — Progress Notes (Signed)
Hypoglycemic Event  CBG: 64  Treatment: D50 25 mL (12.5 gm)  Symptoms: None  Follow-up CBG: Time:0522 CBG Result:202  Possible Reasons for Event: Inadequate meal intake  Comments/MD notified::Hypoglycemic protocol followed Patient discontinued OmniPod  on 01/22/23 @ 2150

## 2023-01-23 NOTE — Discharge Summary (Signed)
Physician Discharge Summary  Carol Guerra [id number] DOB: [birth year] DOA: 01/22/2023  PCP: [facility] [place] date: 01/22/2023 Discharge date: 01/23/2023  Admitted From: Home Disposition:  Home  Recommendations for Outpatient Follow-up:  Follow up with PCP in 1-2 weeks  Discharge Condition:Stable  CODE STATUS:Full  Diet recommendation: Low fat low carb diet    Brief/Interim Summary: Carol Guerra is a 47 y.o. female with medical history significant for insulin-dependent type 2 diabetes with insulin pump, gastroparesis, hypertension and hyperlipidemia being admitted to the hospital with nausea vomiting found to have hyperglycemia and acute pancreatitis.  Patient admitted as above with intractable nausea and vomiting secondary to pancreatitis - likely triggered by hypertriglyceridemia but may be at risk for recurrence while on GLP1 agonist - would recommend close follow up with PCP to further discuss this medication. Patient also reports insulin pump malfunction which has now resolved. Otherwise stable and agreeable for discharge - eating well without symptoms, glucose downtrending appropriately.  Discharge Diagnoses:  Principal Problem:   Acute pancreatitis Active Problems:   DM2 (diabetes mellitus, type 2)   Gastroparesis   Hypokalemia   AKI (acute kidney injury)   Vomiting    Discharge Instructions   Allergies as of 01/23/2023       Reactions   Amlodipine Anaphylaxis   Canagliflozin Anaphylaxis   Clonidine Derivatives Anaphylaxis        Medication List     TAKE these medications    albuterol (2.5 MG/3ML) 0.083% nebulizer solution Commonly known as: PROVENTIL Take 2.5 mg by nebulization every 6 (six) hours as needed for wheezing or shortness of breath.   ALPRAZolam 1 MG tablet Commonly known as: XANAX Take 1 mg by mouth daily as needed for anxiety.   Aspirin Low Dose 81 MG chewable tablet Generic drug: aspirin Chew 81 mg by mouth  daily.   carvedilol 12.5 MG tablet Commonly known as: COREG Take 12.5 mg by mouth 2 (two) times daily with a meal.   chlorthalidone 25 MG tablet Commonly known as: HYGROTON Take 25 mg by mouth daily.   Combivent Respimat 20-100 MCG/ACT Aers respimat Generic drug: Ipratropium-Albuterol Inhale 1 puff into the lungs 4 (four) times daily as needed for shortness of breath.   doxepin 50 MG capsule Commonly known as: SINEQUAN Take 50 mg by mouth at bedtime.   Flovent HFA 110 MCG/ACT inhaler Generic drug: fluticasone Inhale 1 puff into the lungs 2 (two) times daily as needed (SOB/Wheezing).   folic acid 1 MG tablet Commonly known as: FOLVITE Take 1 mg by mouth daily.   gabapentin 300 MG capsule Commonly known as: NEURONTIN Take 600 mg by mouth 3 (three) times daily.   HumaLOG KwikPen 200 UNIT/ML KwikPen Generic drug: insulin lispro Inject 100 Units into the skin See admin instructions. Per Omnipod   hydrOXYzine 50 MG tablet Commonly known as: ATARAX Take 50 mg by mouth every 8 (eight) hours as needed for itching.   Jardiance 25 MG Tabs tablet Generic drug: empagliflozin Take 25 mg by mouth daily.   metFORMIN 500 MG 24 hr tablet Commonly known as: GLUCOPHAGE-XR Take 500 mg by mouth in the morning and at bedtime.   montelukast 10 MG tablet Commonly known as: SINGULAIR Take 10 mg by mouth at bedtime as needed (Allergies).   Mounjaro 5 MG/0.5ML Pen Generic drug: tirzepatide Inject 5 mg into the skin once a week.   omeprazole 40 MG capsule Commonly known as: PRILOSEC Take 40 mg by mouth daily.   Pegasys  180 MCG/ML injection Generic drug: peginterferon alfa-2a Inject 180 mcg into the skin every 7 (seven) days.   potassium chloride 10 MEQ tablet Commonly known as: KLOR-CON Take 1 tablet (10 mEq total) by mouth daily for 5 days.   rosuvastatin 40 MG tablet Commonly known as: CRESTOR Take 1 tablet (40 mg total) by mouth daily. What changed:  medication  strength how much to take   spironolactone 25 MG tablet Commonly known as: ALDACTONE Take 25 mg by mouth daily.   Wal-Dryl Allergy 25 mg capsule Generic drug: diphenhydrAMINE Take 25 mg by mouth 2 (two) times daily as needed for itching or allergies.        Allergies  Allergen Reactions   Amlodipine Anaphylaxis   Canagliflozin Anaphylaxis   Clonidine Derivatives Anaphylaxis    Consultations: None   Procedures/Studies: CT ABDOMEN PELVIS WO CONTRAST  Result Date: 01/22/2023 CLINICAL DATA:  47 year old female with epigastric and left upper quadrant pain, burning. History of diabetes, gastric paresis. EXAM: CT ABDOMEN AND PELVIS WITHOUT CONTRAST TECHNIQUE: Multidetector CT imaging of the abdomen and pelvis was performed following the standard protocol without IV contrast. RADIATION DOSE REDUCTION: This exam was performed according to the departmental dose-optimization program which includes automated exposure control, adjustment of the mA and/or kV according to patient size and/or use of iterative reconstruction technique. COMPARISON:  [facility] CT Abdomen and Pelvis 10/11/2020. FINDINGS: Lower chest: Negative.  No pericardial or pleural effusion. Hepatobiliary: Chronically absent gallbladder. Small benign dystrophic calcification in the right hepatic lobe unchanged since 2021. Background mild noncontrast liver parenchyma heterogeneity probably due to a degree of steatosis. Pancreas: Mild inflammatory stranding and thickening of the left lateral conal fascia at the tail and distal body of the pancreas. See series 2, images 29 and 33. See also coronal image 53. No pancreatic ductal dilatation is evident. No pancreatic mass is evident on this noncontrast exam. Spleen: Negative. Adrenals/Urinary Tract: Negative. No urinary calculus or collecting system enlargement. Stomach/Bowel: Large bowel somewhat redundant but decompressed throughout the abdomen and pelvis. Normal appendix  suspected on coronal image 48. No dilated small bowel. Intermittent fluid containing loops. Terminal ileum within normal limits. Stomach also relatively decompressed. No gastroduodenal inflammation evident on this noncontrast exam. No free air or free fluid identified. Small scattered mesenteric calcifications may be postinflammatory calcified mesenteric lymph nodes, such as on coronal images 51, 46. These are chronic and stable. Vascular/Lymphatic: Normal caliber abdominal aorta. Vascular patency is not evaluated in the absence of IV contrast. No calcified atherosclerosis or lymphadenopathy identified. Reproductive: Chronic fibroid uterus. Multiple partially calcified fibroids, individually about 3 cm, including subserosal lesions such as at the right uterine body series 2, image 76. Negative noncontrast ovaries. Other: No pelvis free fluid. Right ventral abdominal wall insulin pump type device. Musculoskeletal: No acute osseous abnormality identified. Ventral lower abdominal wall subcutaneous stranding is probably injection site related. Small volume soft tissue gas there in 2021. IMPRESSION: 1. Noncontrast CT appearance suspicious for Acute Pancreatitis, with mild inflammatory stranding at the distal pancreatic body and tail. Thickening of the adjacent left lateral conal fascia. No free fluid. 2. No other acute or inflammatory process identified in the noncontrast abdomen or pelvis. Decompressed bowel. 3. Chronic fibroid uterus. Electronically Signed   By: Genevie Ann M.D.   On: 01/22/2023 04:30     Subjective: No acute issues/events overnight - symptoms resolved. Glucose more appropriately controlled after restarting insulin.   Discharge Exam: Vitals:   01/23/23 0012 01/23/23 0410  BP: 106/73  124/78  Pulse: 98 (!) 108  Resp: 16 16  Temp: 98.5 F (36.9 C) 100.3 F (37.9 C)  SpO2: 98% 100%   Vitals:   01/22/23 1725 01/22/23 1958 01/23/23 0012 01/23/23 0410  BP: 117/79 105/74 106/73 124/78  Pulse:  99 95 98 (!) 108  Resp: 20 18 16 16   Temp: 99.1 F (37.3 C) 98.5 F (36.9 C) 98.5 F (36.9 C) 100.3 F (37.9 C)  TempSrc: Oral Oral Oral Oral  SpO2: 98% 99% 98% 100%  Weight:      Height:        General: Pt is alert, awake, not in acute distress Cardiovascular: RRR, S1/S2 +, no rubs, no gallops Respiratory: CTA bilaterally, no wheezing, no rhonchi Abdominal: Soft, NT, ND, bowel sounds + Extremities: no edema, no cyanosis    The results of significant diagnostics from this hospitalization (including imaging, microbiology, ancillary and laboratory) are listed below for reference.     Labs: BNP (last 3 results) No results for input(s): "BNP" in the last 8760 hours. Basic Metabolic Panel: Recent Labs  Lab 01/22/23 0245 01/23/23 0515  NA 132* 135  K 2.8* 2.7*  CL 85* 99  CO2 28 27  GLUCOSE 505* 212*  BUN 18 17  CREATININE 3.00* 1.32*  CALCIUM 10.3 8.5*  MG 2.5*  --    Liver Function Tests: Recent Labs  Lab 01/22/23 0245  AST 43*  ALT 31  ALKPHOS 132*  BILITOT 0.6  PROT 9.9*  ALBUMIN 4.8   Recent Labs  Lab 01/22/23 0245  LIPASE 127*   CBC: Recent Labs  Lab 01/22/23 0245 01/23/23 0515  WBC 11.2* 9.1  HGB 15.0 12.3  HCT 44.3 37.7  MCV 88.1 90.8  PLT 427* 312   CBG: Recent Labs  Lab 01/23/23 0010 01/23/23 0408 01/23/23 0522 01/23/23 0752 01/23/23 1138  GLUCAP 74 64* 202* 119* 104*   Hgb A1c Recent Labs    01/22/23 0245  HGBA1C 11.1*   Lipid Profile Recent Labs    01/23/23 0515  TRIG 336*   Urinalysis    Component Value Date/Time   COLORURINE YELLOW 04/28/2018 1336   APPEARANCEUR CLOUDY (A) 04/28/2018 1336   LABSPEC 1.020 04/28/2018 1336   PHURINE 5.5 04/28/2018 1336   GLUCOSEU >=500 (A) 04/28/2018 1336   HGBUR LARGE (A) 04/28/2018 1336   BILIRUBINUR SMALL (A) 04/28/2018 1336   KETONESUR 15 (A) 04/28/2018 1336   PROTEINUR >300 (A) 04/28/2018 1336   NITRITE POSITIVE (A) 04/28/2018 1336   LEUKOCYTESUR SMALL (A) 04/28/2018 1336    Sepsis Labs Recent Labs  Lab 01/22/23 0245 01/23/23 0515  WBC 11.2* 9.1      Time coordinating discharge: Over 30 minutes  SIGNED:   Little Ishikawa, DO [facility] 01/23/2023, 12:21 PM Pager   If 7PM-7AM, please contact night-coverage [email or web address]
